# Patient Record
Sex: Female | Born: 1950 | Race: White | Hispanic: No | Smoking: Former smoker
Health system: Southern US, Community
[De-identification: ages and names within clinical notes are randomized; demographics above are authoritative.]

## PROBLEM LIST (undated history)

## (undated) DIAGNOSIS — C801 Malignant (primary) neoplasm, unspecified: Secondary | ICD-10-CM

## (undated) DIAGNOSIS — E039 Hypothyroidism, unspecified: Secondary | ICD-10-CM

## (undated) DIAGNOSIS — K219 Gastro-esophageal reflux disease without esophagitis: Secondary | ICD-10-CM

## (undated) DIAGNOSIS — Z8542 Personal history of malignant neoplasm of other parts of uterus: Secondary | ICD-10-CM

## (undated) DIAGNOSIS — F32A Depression, unspecified: Secondary | ICD-10-CM

## (undated) DIAGNOSIS — J45909 Unspecified asthma, uncomplicated: Secondary | ICD-10-CM

## (undated) DIAGNOSIS — F329 Major depressive disorder, single episode, unspecified: Secondary | ICD-10-CM

## (undated) HISTORY — DX: Unspecified asthma, uncomplicated: J45.909

## (undated) HISTORY — DX: Malignant (primary) neoplasm, unspecified: C80.1

## (undated) HISTORY — DX: Personal history of malignant neoplasm of other parts of uterus: Z85.42

## (undated) HISTORY — DX: Hypothyroidism, unspecified: E03.9

## (undated) HISTORY — DX: Gastro-esophageal reflux disease without esophagitis: K21.9

## (undated) HISTORY — DX: Depression, unspecified: F32.A

## (undated) HISTORY — DX: Major depressive disorder, single episode, unspecified: F32.9

---

## 2003-10-09 DIAGNOSIS — D229 Melanocytic nevi, unspecified: Secondary | ICD-10-CM

## 2003-10-09 HISTORY — DX: Melanocytic nevi, unspecified: D22.9

## 2004-11-18 ENCOUNTER — Ambulatory Visit (HOSPITAL_COMMUNITY): Admission: RE | Admit: 2004-11-18 | Discharge: 2004-11-18 | Payer: Self-pay | Admitting: Gastroenterology

## 2005-01-06 ENCOUNTER — Encounter: Admission: RE | Admit: 2005-01-06 | Discharge: 2005-01-06 | Payer: Self-pay | Admitting: Internal Medicine

## 2008-01-31 HISTORY — PX: TOTAL ABDOMINAL HYSTERECTOMY W/ BILATERAL SALPINGOOPHORECTOMY: SHX83

## 2008-04-30 DIAGNOSIS — C801 Malignant (primary) neoplasm, unspecified: Secondary | ICD-10-CM

## 2008-04-30 HISTORY — DX: Malignant (primary) neoplasm, unspecified: C80.1

## 2008-04-30 HISTORY — PX: ABDOMINAL HYSTERECTOMY: SHX81

## 2008-05-26 ENCOUNTER — Ambulatory Visit: Admission: RE | Admit: 2008-05-26 | Discharge: 2008-05-26 | Payer: Self-pay | Admitting: Gynecologic Oncology

## 2008-06-02 ENCOUNTER — Encounter: Payer: Self-pay | Admitting: Gynecologic Oncology

## 2008-06-02 ENCOUNTER — Ambulatory Visit (HOSPITAL_COMMUNITY): Admission: RE | Admit: 2008-06-02 | Discharge: 2008-06-03 | Payer: Self-pay | Admitting: Gynecologic Oncology

## 2008-06-15 ENCOUNTER — Ambulatory Visit: Payer: Self-pay | Admitting: Oncology

## 2008-06-17 ENCOUNTER — Ambulatory Visit: Admission: RE | Admit: 2008-06-17 | Discharge: 2008-06-17 | Payer: Self-pay | Admitting: Gynecologic Oncology

## 2008-06-19 ENCOUNTER — Ambulatory Visit: Admission: RE | Admit: 2008-06-19 | Discharge: 2008-09-17 | Payer: Self-pay | Admitting: Radiation Oncology

## 2008-06-24 LAB — CBC WITH DIFFERENTIAL/PLATELET
BASO%: 1.1 % (ref 0.0–2.0)
EOS%: 12 % — ABNORMAL HIGH (ref 0.0–7.0)
MCH: 30.5 pg (ref 25.1–34.0)
MCHC: 34.2 g/dL (ref 31.5–36.0)
MONO#: 0.6 10*3/uL (ref 0.1–0.9)
RBC: 4.34 10*6/uL (ref 3.70–5.45)
RDW: 12.8 % (ref 11.2–14.5)
WBC: 8.1 10*3/uL (ref 3.9–10.3)
lymph#: 1.9 10*3/uL (ref 0.9–3.3)

## 2008-06-24 LAB — COMPREHENSIVE METABOLIC PANEL
ALT: 50 U/L — ABNORMAL HIGH (ref 0–35)
AST: 23 U/L (ref 0–37)
CO2: 27 mEq/L (ref 19–32)
Calcium: 9.4 mg/dL (ref 8.4–10.5)
Chloride: 102 mEq/L (ref 96–112)
Sodium: 139 mEq/L (ref 135–145)
Total Protein: 7.4 g/dL (ref 6.0–8.3)

## 2008-07-03 LAB — CBC WITH DIFFERENTIAL/PLATELET
BASO%: 1.6 % (ref 0.0–2.0)
LYMPH%: 22.8 % (ref 14.0–49.7)
MCHC: 34.1 g/dL (ref 31.5–36.0)
MONO#: 0.1 10*3/uL (ref 0.1–0.9)
Platelets: 231 10*3/uL (ref 145–400)
RBC: 4.26 10*6/uL (ref 3.70–5.45)
WBC: 3.7 10*3/uL — ABNORMAL LOW (ref 3.9–10.3)

## 2008-07-10 LAB — COMPREHENSIVE METABOLIC PANEL
ALT: 23 U/L (ref 0–35)
CO2: 23 mEq/L (ref 19–32)
Calcium: 9.2 mg/dL (ref 8.4–10.5)
Chloride: 106 mEq/L (ref 96–112)
Potassium: 4.1 mEq/L (ref 3.5–5.3)
Sodium: 141 mEq/L (ref 135–145)
Total Protein: 7.3 g/dL (ref 6.0–8.3)

## 2008-07-10 LAB — CBC WITH DIFFERENTIAL/PLATELET
BASO%: 1.7 % (ref 0.0–2.0)
HCT: 37 % (ref 34.8–46.6)
MCHC: 35.2 g/dL (ref 31.5–36.0)
MONO#: 0.4 10*3/uL (ref 0.1–0.9)
NEUT%: 22.7 % — ABNORMAL LOW (ref 38.4–76.8)
RBC: 4.25 10*6/uL (ref 3.70–5.45)
WBC: 2.6 10*3/uL — ABNORMAL LOW (ref 3.9–10.3)
lymph#: 1.3 10*3/uL (ref 0.9–3.3)

## 2008-07-13 LAB — CBC WITH DIFFERENTIAL/PLATELET
BASO%: 0.7 % (ref 0.0–2.0)
LYMPH%: 28.2 % (ref 14.0–49.7)
MCHC: 33.5 g/dL (ref 31.5–36.0)
MONO#: 0.6 10*3/uL (ref 0.1–0.9)
RBC: 4.53 10*6/uL (ref 3.70–5.45)
WBC: 5.6 10*3/uL (ref 3.9–10.3)
lymph#: 1.6 10*3/uL (ref 0.9–3.3)

## 2008-07-17 LAB — CBC WITH DIFFERENTIAL/PLATELET
BASO%: 0.1 % (ref 0.0–2.0)
HCT: 38.7 % (ref 34.8–46.6)
HGB: 13.4 g/dL (ref 11.6–15.9)
MCHC: 34.6 g/dL (ref 31.5–36.0)
MONO#: 0.1 10*3/uL (ref 0.1–0.9)
NEUT%: 88.3 % — ABNORMAL HIGH (ref 38.4–76.8)
RDW: 13.6 % (ref 11.2–14.5)
WBC: 8.4 10*3/uL (ref 3.9–10.3)
lymph#: 0.8 10*3/uL — ABNORMAL LOW (ref 0.9–3.3)

## 2008-07-23 LAB — URINALYSIS, MICROSCOPIC - CHCC
Bilirubin (Urine): NEGATIVE
Ketones: NEGATIVE mg/dL
Protein: <30 mg/dL
Specific Gravity, Urine: 1.025 (ref 1.003–1.035)

## 2008-07-25 LAB — URINE CULTURE

## 2008-07-29 ENCOUNTER — Ambulatory Visit: Payer: Self-pay | Admitting: Oncology

## 2008-07-31 LAB — CBC WITH DIFFERENTIAL/PLATELET
BASO%: 0.5 % (ref 0.0–2.0)
Basophils Absolute: 0 10*3/uL (ref 0.0–0.1)
EOS%: 3.5 % (ref 0.0–7.0)
MCH: 30.6 pg (ref 25.1–34.0)
MCHC: 35.1 g/dL (ref 31.5–36.0)
MCV: 87.2 fL (ref 79.5–101.0)
MONO%: 7.7 % (ref 0.0–14.0)
RBC: 3.98 10*6/uL (ref 3.70–5.45)
RDW: 14.3 % (ref 11.2–14.5)

## 2008-07-31 LAB — COMPREHENSIVE METABOLIC PANEL
ALT: 21 U/L (ref 0–35)
AST: 19 U/L (ref 0–37)
Alkaline Phosphatase: 89 U/L (ref 39–117)
Potassium: 4.1 mEq/L (ref 3.5–5.3)
Sodium: 138 mEq/L (ref 135–145)
Total Bilirubin: 0.4 mg/dL (ref 0.3–1.2)
Total Protein: 7.2 g/dL (ref 6.0–8.3)

## 2008-08-07 LAB — CBC WITH DIFFERENTIAL/PLATELET
BASO%: 0.4 % (ref 0.0–2.0)
EOS%: 0 % (ref 0.0–7.0)
Eosinophils Absolute: 0 10*3/uL (ref 0.0–0.5)
LYMPH%: 21.7 % (ref 14.0–49.7)
MCH: 29.4 pg (ref 25.1–34.0)
MCHC: 34.5 g/dL (ref 31.5–36.0)
MCV: 85.4 fL (ref 79.5–101.0)
MONO%: 1.2 % (ref 0.0–14.0)
Platelets: 243 10*3/uL (ref 145–400)
RBC: 4.18 10*6/uL (ref 3.70–5.45)

## 2008-08-21 ENCOUNTER — Ambulatory Visit (HOSPITAL_COMMUNITY): Admission: RE | Admit: 2008-08-21 | Discharge: 2008-08-21 | Payer: Self-pay | Admitting: Radiation Oncology

## 2008-09-02 ENCOUNTER — Ambulatory Visit: Payer: Self-pay | Admitting: Oncology

## 2008-09-04 LAB — CBC WITH DIFFERENTIAL/PLATELET
BASO%: 0.3 % (ref 0.0–2.0)
Basophils Absolute: 0 10*3/uL (ref 0.0–0.1)
EOS%: 3.7 % (ref 0.0–7.0)
HCT: 33.6 % — ABNORMAL LOW (ref 34.8–46.6)
HGB: 11.8 g/dL (ref 11.6–15.9)
LYMPH%: 18.6 % (ref 14.0–49.7)
MCH: 31.2 pg (ref 25.1–34.0)
MCHC: 35 g/dL (ref 31.5–36.0)
MCV: 89.1 fL (ref 79.5–101.0)
MONO%: 11.5 % (ref 0.0–14.0)
NEUT%: 65.9 % (ref 38.4–76.8)

## 2008-09-18 ENCOUNTER — Ambulatory Visit: Admission: RE | Admit: 2008-09-18 | Discharge: 2008-12-17 | Payer: Self-pay | Admitting: Radiation Oncology

## 2008-10-13 LAB — CBC WITH DIFFERENTIAL/PLATELET
BASO%: 0.4 % (ref 0.0–2.0)
HCT: 32.6 % — ABNORMAL LOW (ref 34.8–46.6)
LYMPH%: 13.9 % — ABNORMAL LOW (ref 14.0–49.7)
MCH: 32.7 pg (ref 25.1–34.0)
MCHC: 35.6 g/dL (ref 31.5–36.0)
MONO#: 0.3 10*3/uL (ref 0.1–0.9)
NEUT%: 70.3 % (ref 38.4–76.8)
Platelets: 269 10*3/uL (ref 145–400)
WBC: 3.7 10*3/uL — ABNORMAL LOW (ref 3.9–10.3)

## 2008-10-13 LAB — COMPREHENSIVE METABOLIC PANEL
ALT: 31 U/L (ref 0–35)
CO2: 27 mEq/L (ref 19–32)
Creatinine, Ser: 0.63 mg/dL (ref 0.40–1.20)
Total Bilirubin: 0.7 mg/dL (ref 0.3–1.2)

## 2008-10-16 ENCOUNTER — Ambulatory Visit: Payer: Self-pay | Admitting: Oncology

## 2008-10-20 ENCOUNTER — Other Ambulatory Visit: Admission: RE | Admit: 2008-10-20 | Discharge: 2008-10-20 | Payer: Self-pay | Admitting: Internal Medicine

## 2008-10-20 LAB — CBC WITH DIFFERENTIAL/PLATELET
Basophils Absolute: 0 10*3/uL (ref 0.0–0.1)
Eosinophils Absolute: 0.1 10*3/uL (ref 0.0–0.5)
HCT: 33.5 % — ABNORMAL LOW (ref 34.8–46.6)
LYMPH%: 14.3 % (ref 14.0–49.7)
MCHC: 35.8 g/dL (ref 31.5–36.0)
MONO#: 0.3 10*3/uL (ref 0.1–0.9)
NEUT#: 2 10*3/uL (ref 1.5–6.5)
NEUT%: 69.4 % (ref 38.4–76.8)
Platelets: 238 10*3/uL (ref 145–400)
WBC: 2.8 10*3/uL — ABNORMAL LOW (ref 3.9–10.3)

## 2008-10-20 LAB — COMPREHENSIVE METABOLIC PANEL
AST: 14 U/L (ref 0–37)
Albumin: 4.3 g/dL (ref 3.5–5.2)
BUN: 14 mg/dL (ref 6–23)
CO2: 21 mEq/L (ref 19–32)
Calcium: 9.2 mg/dL (ref 8.4–10.5)
Chloride: 106 mEq/L (ref 96–112)
Creatinine, Ser: 0.62 mg/dL (ref 0.40–1.20)
Potassium: 4.2 mEq/L (ref 3.5–5.3)

## 2008-10-20 LAB — URINALYSIS, MICROSCOPIC - CHCC
Bilirubin (Urine): NEGATIVE
Ketones: 5 mg/dL
Protein: NEGATIVE mg/dL
pH: 6 (ref 4.6–8.0)

## 2008-10-22 LAB — URINE CULTURE

## 2008-10-27 ENCOUNTER — Ambulatory Visit: Admission: RE | Admit: 2008-10-27 | Discharge: 2008-10-27 | Payer: Self-pay | Admitting: Gynecologic Oncology

## 2008-11-09 ENCOUNTER — Ambulatory Visit: Payer: Self-pay | Admitting: Oncology

## 2008-11-11 LAB — CBC WITH DIFFERENTIAL/PLATELET
Eosinophils Absolute: 0 10*3/uL (ref 0.0–0.5)
HCT: 37.3 % (ref 34.8–46.6)
LYMPH%: 6.1 % — ABNORMAL LOW (ref 14.0–49.7)
MCHC: 34.3 g/dL (ref 31.5–36.0)
MCV: 93.3 fL (ref 79.5–101.0)
MONO#: 0 10*3/uL — ABNORMAL LOW (ref 0.1–0.9)
MONO%: 0.3 % (ref 0.0–14.0)
NEUT%: 93.5 % — ABNORMAL HIGH (ref 38.4–76.8)
Platelets: 266 10*3/uL (ref 145–400)
WBC: 7.4 10*3/uL (ref 3.9–10.3)

## 2008-11-26 LAB — CBC WITH DIFFERENTIAL/PLATELET
BASO%: 0.1 % (ref 0.0–2.0)
EOS%: 2.3 % (ref 0.0–7.0)
HCT: 32.9 % — ABNORMAL LOW (ref 34.8–46.6)
LYMPH%: 12.4 % — ABNORMAL LOW (ref 14.0–49.7)
MCH: 33.4 pg (ref 25.1–34.0)
MCHC: 35.5 g/dL (ref 31.5–36.0)
MONO%: 9.8 % (ref 0.0–14.0)
NEUT%: 75.4 % (ref 38.4–76.8)
Platelets: 190 10*3/uL (ref 145–400)

## 2008-11-26 LAB — COMPREHENSIVE METABOLIC PANEL
ALT: 18 U/L (ref 0–35)
AST: 17 U/L (ref 0–37)
Alkaline Phosphatase: 66 U/L (ref 39–117)
Creatinine, Ser: 0.52 mg/dL (ref 0.40–1.20)
Total Bilirubin: 0.3 mg/dL (ref 0.3–1.2)

## 2008-12-02 LAB — CBC WITH DIFFERENTIAL/PLATELET
Basophils Absolute: 0 10*3/uL (ref 0.0–0.1)
Eosinophils Absolute: 0 10*3/uL (ref 0.0–0.5)
HGB: 12.5 g/dL (ref 11.6–15.9)
MCV: 95 fL (ref 79.5–101.0)
MONO#: 0 10*3/uL — ABNORMAL LOW (ref 0.1–0.9)
NEUT#: 7.3 10*3/uL — ABNORMAL HIGH (ref 1.5–6.5)
RBC: 3.79 10*6/uL (ref 3.70–5.45)
RDW: 13.1 % (ref 11.2–14.5)
WBC: 7.8 10*3/uL (ref 3.9–10.3)
lymph#: 0.4 10*3/uL — ABNORMAL LOW (ref 0.9–3.3)

## 2008-12-11 ENCOUNTER — Ambulatory Visit: Payer: Self-pay | Admitting: Oncology

## 2008-12-15 LAB — COMPREHENSIVE METABOLIC PANEL
Albumin: 4.4 g/dL (ref 3.5–5.2)
Alkaline Phosphatase: 86 U/L (ref 39–117)
BUN: 19 mg/dL (ref 6–23)
CO2: 22 mEq/L (ref 19–32)
Glucose, Bld: 109 mg/dL — ABNORMAL HIGH (ref 70–99)
Sodium: 138 mEq/L (ref 135–145)
Total Bilirubin: 0.5 mg/dL (ref 0.3–1.2)
Total Protein: 7.1 g/dL (ref 6.0–8.3)

## 2008-12-15 LAB — CBC WITH DIFFERENTIAL/PLATELET
Basophils Absolute: 0 10*3/uL (ref 0.0–0.1)
Eosinophils Absolute: 0.1 10*3/uL (ref 0.0–0.5)
HCT: 32.9 % — ABNORMAL LOW (ref 34.8–46.6)
HGB: 11.4 g/dL — ABNORMAL LOW (ref 11.6–15.9)
LYMPH%: 7.9 % — ABNORMAL LOW (ref 14.0–49.7)
MCV: 94.8 fL (ref 79.5–101.0)
MONO#: 0.4 10*3/uL (ref 0.1–0.9)
MONO%: 4 % (ref 0.0–14.0)
NEUT#: 7.8 10*3/uL — ABNORMAL HIGH (ref 1.5–6.5)
Platelets: 174 10*3/uL (ref 145–400)
WBC: 9 10*3/uL (ref 3.9–10.3)

## 2008-12-23 LAB — CBC WITH DIFFERENTIAL/PLATELET
Basophils Absolute: 0 10*3/uL (ref 0.0–0.1)
Eosinophils Absolute: 0 10*3/uL (ref 0.0–0.5)
HGB: 11.5 g/dL — ABNORMAL LOW (ref 11.6–15.9)
MCV: 94.3 fL (ref 79.5–101.0)
MONO#: 0 10*3/uL — ABNORMAL LOW (ref 0.1–0.9)
MONO%: 0.7 % (ref 0.0–14.0)
NEUT#: 5.7 10*3/uL (ref 1.5–6.5)
Platelets: 181 10*3/uL (ref 145–400)
RBC: 3.49 10*6/uL — ABNORMAL LOW (ref 3.70–5.45)
RDW: 15.2 % — ABNORMAL HIGH (ref 11.2–14.5)
WBC: 6.1 10*3/uL (ref 3.9–10.3)

## 2008-12-30 ENCOUNTER — Encounter: Admission: RE | Admit: 2008-12-30 | Discharge: 2008-12-30 | Payer: Self-pay | Admitting: Oncology

## 2009-01-25 ENCOUNTER — Ambulatory Visit (HOSPITAL_COMMUNITY): Admission: RE | Admit: 2009-01-25 | Discharge: 2009-01-25 | Payer: Self-pay | Admitting: Oncology

## 2009-01-27 ENCOUNTER — Ambulatory Visit: Admission: RE | Admit: 2009-01-27 | Discharge: 2009-01-27 | Payer: Self-pay | Admitting: Gynecologic Oncology

## 2009-03-30 ENCOUNTER — Encounter (INDEPENDENT_AMBULATORY_CARE_PROVIDER_SITE_OTHER): Payer: Self-pay | Admitting: *Deleted

## 2009-04-16 ENCOUNTER — Ambulatory Visit: Payer: Self-pay | Admitting: Internal Medicine

## 2009-04-16 DIAGNOSIS — J309 Allergic rhinitis, unspecified: Secondary | ICD-10-CM | POA: Insufficient documentation

## 2009-04-16 DIAGNOSIS — K219 Gastro-esophageal reflux disease without esophagitis: Secondary | ICD-10-CM | POA: Insufficient documentation

## 2009-04-16 DIAGNOSIS — C55 Malignant neoplasm of uterus, part unspecified: Secondary | ICD-10-CM

## 2009-04-16 DIAGNOSIS — E039 Hypothyroidism, unspecified: Secondary | ICD-10-CM

## 2009-04-16 DIAGNOSIS — G479 Sleep disorder, unspecified: Secondary | ICD-10-CM | POA: Insufficient documentation

## 2009-04-22 ENCOUNTER — Ambulatory Visit: Payer: Self-pay | Admitting: Oncology

## 2009-04-26 LAB — CBC WITH DIFFERENTIAL/PLATELET
BASO%: 0.4 % (ref 0.0–2.0)
Basophils Absolute: 0 10*3/uL (ref 0.0–0.1)
EOS%: 3.3 % (ref 0.0–7.0)
Eosinophils Absolute: 0.2 10*3/uL (ref 0.0–0.5)
HCT: 38.6 % (ref 34.8–46.6)
HGB: 13.6 g/dL (ref 11.6–15.9)
LYMPH%: 16.6 % (ref 14.0–49.7)
MCH: 33 pg (ref 25.1–34.0)
MCHC: 35.1 g/dL (ref 31.5–36.0)
MCV: 94 fL (ref 79.5–101.0)
MONO#: 0.4 10*3/uL (ref 0.1–0.9)
MONO%: 8.8 % (ref 0.0–14.0)
NEUT#: 3.5 10*3/uL (ref 1.5–6.5)
NEUT%: 70.9 % (ref 38.4–76.8)
Platelets: 230 10*3/uL (ref 145–400)
RBC: 4.11 10*6/uL (ref 3.70–5.45)
RDW: 12.8 % (ref 11.2–14.5)
WBC: 4.9 10*3/uL (ref 3.9–10.3)
lymph#: 0.8 10*3/uL — ABNORMAL LOW (ref 0.9–3.3)

## 2009-04-26 LAB — COMPREHENSIVE METABOLIC PANEL
ALT: 10 U/L (ref 0–35)
AST: 13 U/L (ref 0–37)
Albumin: 4.5 g/dL (ref 3.5–5.2)
Alkaline Phosphatase: 60 U/L (ref 39–117)
BUN: 22 mg/dL (ref 6–23)
CO2: 25 mEq/L (ref 19–32)
Calcium: 9.3 mg/dL (ref 8.4–10.5)
Chloride: 104 mEq/L (ref 96–112)
Creatinine, Ser: 0.6 mg/dL (ref 0.40–1.20)
Glucose, Bld: 93 mg/dL (ref 70–99)
Potassium: 4 mEq/L (ref 3.5–5.3)
Sodium: 139 mEq/L (ref 135–145)
Total Bilirubin: 0.6 mg/dL (ref 0.3–1.2)
Total Protein: 6.9 g/dL (ref 6.0–8.3)

## 2009-06-29 ENCOUNTER — Ambulatory Visit: Payer: Self-pay | Admitting: Oncology

## 2009-06-30 ENCOUNTER — Ambulatory Visit (HOSPITAL_COMMUNITY): Admission: RE | Admit: 2009-06-30 | Discharge: 2009-06-30 | Payer: Self-pay | Admitting: Oncology

## 2009-06-30 LAB — CBC WITH DIFFERENTIAL/PLATELET
BASO%: 0.5 % (ref 0.0–2.0)
Basophils Absolute: 0 10*3/uL (ref 0.0–0.1)
EOS%: 3 % (ref 0.0–7.0)
Eosinophils Absolute: 0.1 10*3/uL (ref 0.0–0.5)
HCT: 39 % (ref 34.8–46.6)
HGB: 13.7 g/dL (ref 11.6–15.9)
LYMPH%: 17.7 % (ref 14.0–49.7)
MCH: 32.9 pg (ref 25.1–34.0)
MCHC: 35.2 g/dL (ref 31.5–36.0)
MCV: 93.5 fL (ref 79.5–101.0)
MONO#: 0.4 10*3/uL (ref 0.1–0.9)
MONO%: 8.8 % (ref 0.0–14.0)
NEUT#: 2.9 10*3/uL (ref 1.5–6.5)
NEUT%: 70 % (ref 38.4–76.8)
Platelets: 245 10*3/uL (ref 145–400)
RBC: 4.17 10*6/uL (ref 3.70–5.45)
RDW: 13.6 % (ref 11.2–14.5)
WBC: 4.2 10*3/uL (ref 3.9–10.3)
lymph#: 0.7 10*3/uL — ABNORMAL LOW (ref 0.9–3.3)

## 2009-06-30 LAB — COMPREHENSIVE METABOLIC PANEL
ALT: 15 U/L (ref 0–35)
AST: 18 U/L (ref 0–37)
Albumin: 3.9 g/dL (ref 3.5–5.2)
Alkaline Phosphatase: 62 U/L (ref 39–117)
BUN: 14 mg/dL (ref 6–23)
CO2: 28 mEq/L (ref 19–32)
Calcium: 9.6 mg/dL (ref 8.4–10.5)
Chloride: 103 mEq/L (ref 96–112)
Creatinine, Ser: 0.52 mg/dL (ref 0.40–1.20)
Glucose, Bld: 100 mg/dL — ABNORMAL HIGH (ref 70–99)
Potassium: 4.2 mEq/L (ref 3.5–5.3)
Sodium: 139 mEq/L (ref 135–145)
Total Bilirubin: 0.9 mg/dL (ref 0.3–1.2)
Total Protein: 7.2 g/dL (ref 6.0–8.3)

## 2009-06-30 LAB — TSH: TSH: 2.72 u[IU]/mL (ref 0.350–4.500)

## 2009-07-07 ENCOUNTER — Ambulatory Visit: Admission: RE | Admit: 2009-07-07 | Discharge: 2009-07-07 | Payer: Self-pay | Admitting: Gynecologic Oncology

## 2009-07-07 ENCOUNTER — Other Ambulatory Visit: Admission: RE | Admit: 2009-07-07 | Discharge: 2009-07-07 | Payer: Self-pay | Admitting: Gynecologic Oncology

## 2009-09-27 ENCOUNTER — Ambulatory Visit: Payer: Self-pay | Admitting: Oncology

## 2009-10-19 ENCOUNTER — Encounter: Payer: Self-pay | Admitting: Internal Medicine

## 2009-10-19 LAB — COMPREHENSIVE METABOLIC PANEL
ALT: 12 U/L (ref 0–35)
AST: 14 U/L (ref 0–37)
Albumin: 4.3 g/dL (ref 3.5–5.2)
Alkaline Phosphatase: 69 U/L (ref 39–117)
BUN: 18 mg/dL (ref 6–23)
CO2: 23 mEq/L (ref 19–32)
Calcium: 8.8 mg/dL (ref 8.4–10.5)
Chloride: 106 mEq/L (ref 96–112)
Creatinine, Ser: 0.58 mg/dL (ref 0.40–1.20)
Glucose, Bld: 113 mg/dL — ABNORMAL HIGH (ref 70–99)
Potassium: 4.4 mEq/L (ref 3.5–5.3)
Sodium: 140 mEq/L (ref 135–145)
Total Bilirubin: 0.5 mg/dL (ref 0.3–1.2)
Total Protein: 6.9 g/dL (ref 6.0–8.3)

## 2009-10-19 LAB — CBC WITH DIFFERENTIAL/PLATELET
BASO%: 0.3 % (ref 0.0–2.0)
Basophils Absolute: 0 10*3/uL (ref 0.0–0.1)
EOS%: 3 % (ref 0.0–7.0)
Eosinophils Absolute: 0.1 10*3/uL (ref 0.0–0.5)
HCT: 39.8 % (ref 34.8–46.6)
HGB: 13.5 g/dL (ref 11.6–15.9)
LYMPH%: 18.2 % (ref 14.0–49.7)
MCH: 32 pg (ref 25.1–34.0)
MCHC: 33.9 g/dL (ref 31.5–36.0)
MCV: 94.4 fL (ref 79.5–101.0)
MONO#: 0.3 10*3/uL (ref 0.1–0.9)
MONO%: 6.1 % (ref 0.0–14.0)
NEUT#: 3.5 10*3/uL (ref 1.5–6.5)
NEUT%: 72.4 % (ref 38.4–76.8)
Platelets: 233 10*3/uL (ref 145–400)
RBC: 4.21 10*6/uL (ref 3.70–5.45)
RDW: 13 % (ref 11.2–14.5)
WBC: 4.8 10*3/uL (ref 3.9–10.3)
lymph#: 0.9 10*3/uL (ref 0.9–3.3)

## 2009-12-31 ENCOUNTER — Encounter: Admission: RE | Admit: 2009-12-31 | Discharge: 2009-12-31 | Payer: Self-pay | Admitting: Oncology

## 2010-01-27 ENCOUNTER — Ambulatory Visit
Admission: RE | Admit: 2010-01-27 | Discharge: 2010-01-27 | Payer: Self-pay | Source: Home / Self Care | Attending: Gynecologic Oncology | Admitting: Gynecologic Oncology

## 2010-01-27 ENCOUNTER — Other Ambulatory Visit
Admission: RE | Admit: 2010-01-27 | Discharge: 2010-01-27 | Payer: Self-pay | Source: Home / Self Care | Admitting: Gynecologic Oncology

## 2010-03-01 NOTE — Letter (Signed)
Summary: New Patient Letter  [facility] [place]  [address] [place], [place] [address]   Phone: [phone]  Fax: [phone]       03/30/2009 MRN: [id number]  [name] [address] [place], [place]  [address]  Dear [name],   Welcome to [facility] and thank you for choosing [place] as your Primary Care Providers. Enclosed you will find information about our practice that we hope you find helpful. We have also enclosed forms to be filled out prior to your visit. This will provide [place] with the necessary information and facilitate your being seen in a timely manner. If you have any questions, please call us at: [phone] and we will be happy to assist you. We look forward to seeing you at your scheduled appointment time.  Appointment MARCH [phone] @ 2:00 PM  with Dr. Marga Melnick  Sincerely,  Primary Health Care Team  Please arrive 15 minutes early for your first appointment and bring your insurance card. Co-pay is required at the time of your visit.  *****Please call the office if you are not able to keep this appointment. There is a charge of $50.00 if any appointment is not cancelled or rescheduled within 24 hours.

## 2010-03-01 NOTE — Letter (Signed)
Summary: [facility]  [facility]   Imported By: Lanelle Bal 11/10/2009 11:57:43  _____________________________________________________________________  External Attachment:    Type:   Image     Comment:   External Document

## 2010-03-01 NOTE — Assessment & Plan Note (Signed)
Summary: NEW TO [facility]   Vital Signs:  Patient profile:   60 year old female Height:      66.25 inches Weight:      178.4 pounds BMI:     28.68 Temp:     98.8 degrees F oral Pulse rate:   72 / minute Resp:     14 per minute BP sitting:   148 / 86  (left arm) Cuff size:   large  Vitals Entered By: [name] (April 16, 2009 1:32 PM) CC: New patient Establish, Insomnia, Diarrhea Comments REVIEWED MED LIST, PATIENT AGREED DOSE AND INSTRUCTION CORRECT    CC:  New patient Establish, Insomnia, and Diarrhea.  History of Present Illness: [name] has 2 issues : #1) sleep disorder & #2) diarrhea    The patient reports early awakening about 3-4 am, but denies difficulty falling asleep, frequent awakening usually, nightmares, leg movements, snoring, apnea noted by partner, and daytime somnolence.  Cancer Rx may have caused memory loss.  Behaviors that may contribute to insomnia include reading in bed, watching TV in bed, and OTC sleep aids, esp Benadryl . Supplements used : Valerian & Melatonin. Treatments tried in the past and found to be ineffective incude sedatives.  No regular exercise.    The patient reports >6 stools per day, semiformed/loose stools to  watery/unformed stools,  occa voluminous stools, mucus in stool, malodorous stools, fecal urgency,  occasional fecal soiling, alternating diarrhea/constipation (constipation only  with meds), nocturnal diarrhea, gassiness, and abrupt onset of symptoms  after radiation to pelvic area, but denies blood in stool, greasy stools, fasting diarrhea, and bloating.  Associated symptoms include abdominal cramps, nausea, and lightheadedness.  The patient denies fever, abdominal pain, vomiting, joint pains, mouth ulcers, and eye redness.  The symptoms are worse with specific foods,ie raw vegetables, not dairy or wheat.  The symptoms are better with hypomotility agents, Lomotil  but not Immodium . A probiotic helps. Patient has a   history of irritable bowel syndrome.  Colonoscopy negative 2007, Dr Renaldo Reel.  Allergies (verified): 1)  ! Levaquin  Past History:  Past Medical History: Uterine cancer Allergic rhinitis GERD Hypothyroidism, Cytomel Rxed by Dr Ananias Pilgrim  Past Surgical History: Hysterectomy & BSO, S/ P Chemotherapy & Radiation  2010; G 0 P 0  Family History: Father: Deceased, Alcoholism Mother: Deceased, Alcoholism, Arthritis, Stroke, DM Siblings: 1 Brother, 1Sister  Physical Exam  General:  well-nourished; alert,appropriate and cooperative throughout examination Head:  Normocephalic and atraumatic without obvious abnormalities.  Neck:  No deformities, masses, or tenderness noted. Lungs:  Normal respiratory effort, chest expands symmetrically. Lungs are clear to auscultation, no crackles or wheezes. Heart:  Normal rate and regular rhythm. S1 and S2 normal without gallop, murmur, click, rub.S4 Abdomen:  Bowel sounds positive,abdomen soft and non-tender without masses, organomegaly or hernias noted. Msk:  No deformity or scoliosis noted of thoracic or lumbar spine.   Pulses:  R and L carotid,radial,dorsalis pedis and posterior tibial pulses are full and equal bilaterally Extremities:  No clubbing, cyanosis.trace left pedal edema and trace right pedal edema.   Neurologic:  alert & oriented X3 and DTRs symmetrical and normal.   Skin:  Intact without suspicious lesions or rashes Cervical Nodes:  No lymphadenopathy noted Axillary Nodes:  No palpable lymphadenopathy Psych:  memory intact for recent and remote, normally interactive, and good eye contact.     Impression & Recommendations:  Problem # 1:  SLEEP DISORDER (ICD-780.50)  Problem # 2:  DIARRHEA (ICD-787.91)  trigger :  raw vegetables  Her updated medication list for this problem includes:    Lonox 2.5-0.025 Mg Tabs (Diphenoxylate-atropine) .Marland Kitchen... 1 as needed for frank diarrhea  Problem # 3:  UTERINE CANCER, HX OF (ICD-V10.42)  Complete  Medication List: 1)  Prozac 20 Mg Caps (Fluoxetine hcl) .Marland Kitchen.. 1 by mouth once daily 2)  Cytomel 25 Mcg Tabs (Liothyronine sodium) .Marland Kitchen.. 1 by mouth once daily 3)  Lonox 2.5-0.025 Mg Tabs (Diphenoxylate-atropine) .Marland Kitchen.. 1 as needed for frank diarrhea  Patient Instructions: 1)  Sleep Hygiene  & nutritional interventions  as discussed. Sublingual anti spasmotic could be considered for abd cramps as needed  if symptoms persist or progress. Prescriptions: LONOX 2.5-0.025 MG TABS (DIPHENOXYLATE-ATROPINE) 1 as needed for frank diarrhea  #10 x 1   Entered and Authorized by:   Marga Melnick MD   Signed by:   Marga Melnick MD on 04/16/2009   Method used:   Print then Give to Patient   RxID:   [id number]

## 2010-04-04 ENCOUNTER — Encounter: Payer: Self-pay | Admitting: Internal Medicine

## 2010-04-19 ENCOUNTER — Other Ambulatory Visit: Payer: Self-pay | Admitting: Oncology

## 2010-04-19 ENCOUNTER — Ambulatory Visit (HOSPITAL_BASED_OUTPATIENT_CLINIC_OR_DEPARTMENT_OTHER): Payer: BC Managed Care – PPO | Admitting: Oncology

## 2010-04-19 DIAGNOSIS — C549 Malignant neoplasm of corpus uteri, unspecified: Secondary | ICD-10-CM

## 2010-04-19 DIAGNOSIS — K219 Gastro-esophageal reflux disease without esophagitis: Secondary | ICD-10-CM

## 2010-04-19 LAB — CBC WITH DIFFERENTIAL/PLATELET
BASO%: 0.5 % (ref 0.0–2.0)
Basophils Absolute: 0 10*3/uL (ref 0.0–0.1)
EOS%: 5.8 % (ref 0.0–7.0)
Eosinophils Absolute: 0.2 10*3/uL (ref 0.0–0.5)
HCT: 37.5 % (ref 34.8–46.6)
HGB: 13.1 g/dL (ref 11.6–15.9)
LYMPH%: 21.5 % (ref 14.0–49.7)
MCH: 32.9 pg (ref 25.1–34.0)
MCHC: 35 g/dL (ref 31.5–36.0)
MCV: 93.8 fL (ref 79.5–101.0)
MONO#: 0.5 10*3/uL (ref 0.1–0.9)
MONO%: 11.4 % (ref 0.0–14.0)
NEUT#: 2.4 10*3/uL (ref 1.5–6.5)
NEUT%: 60.8 % (ref 38.4–76.8)
Platelets: 229 10*3/uL (ref 145–400)
RBC: 4 10*6/uL (ref 3.70–5.45)
WBC: 3.9 10*3/uL (ref 3.9–10.3)
lymph#: 0.8 10*3/uL — ABNORMAL LOW (ref 0.9–3.3)

## 2010-04-19 LAB — COMPREHENSIVE METABOLIC PANEL
ALT: 16 U/L (ref 0–35)
AST: 15 U/L (ref 0–37)
Albumin: 4.2 g/dL (ref 3.5–5.2)
Alkaline Phosphatase: 50 U/L (ref 39–117)
BUN: 19 mg/dL (ref 6–23)
CO2: 26 mEq/L (ref 19–32)
Calcium: 9.3 mg/dL (ref 8.4–10.5)
Chloride: 103 mEq/L (ref 96–112)
Creatinine, Ser: 0.58 mg/dL (ref 0.40–1.20)
Glucose, Bld: 93 mg/dL (ref 70–99)
Potassium: 3.9 mEq/L (ref 3.5–5.3)
Sodium: 139 mEq/L (ref 135–145)
Total Bilirubin: 0.4 mg/dL (ref 0.3–1.2)
Total Protein: 6.4 g/dL (ref 6.0–8.3)

## 2010-04-19 NOTE — Letter (Signed)
Summary: Dr. Elnoria Howard Progress note  Dr. Elnoria Howard Progress note   Imported By: Kassie Mends 04/15/2010 08:01:02  _____________________________________________________________________  External Attachment:    Type:   Image     Comment:   External Document

## 2010-05-06 LAB — FLOW CYTOMETRY REQUEST - FLUID (INPATIENT)

## 2010-05-10 LAB — BASIC METABOLIC PANEL
BUN: 4 mg/dL — ABNORMAL LOW (ref 6–23)
CO2: 26 mEq/L (ref 19–32)
Calcium: 8.2 mg/dL — ABNORMAL LOW (ref 8.4–10.5)
Chloride: 107 mEq/L (ref 96–112)
Creatinine, Ser: 0.64 mg/dL (ref 0.4–1.2)
GFR calc Af Amer: >60 mL/min (ref >60)
GFR calc non Af Amer: >60 mL/min (ref >60)
Glucose, Bld: 134 mg/dL — ABNORMAL HIGH (ref 70–99)
Potassium: 4.1 mEq/L (ref 3.5–5.1)
Sodium: 137 mEq/L (ref 135–145)

## 2010-05-10 LAB — CBC
HCT: 31.9 % — ABNORMAL LOW (ref 36.0–46.0)
Hemoglobin: 11.1 g/dL — ABNORMAL LOW (ref 12.0–15.0)
MCHC: 34.9 g/dL (ref 30.0–36.0)
MCV: 91.5 fL (ref 78.0–100.0)
Platelets: 246 10*3/uL (ref 150–400)
RBC: 3.49 MIL/uL — ABNORMAL LOW (ref 3.87–5.11)
RDW: 13.4 % (ref 11.5–15.5)
WBC: 7.2 10*3/uL (ref 4.0–10.5)

## 2010-05-11 LAB — COMPREHENSIVE METABOLIC PANEL
ALT: 25 U/L (ref 0–35)
AST: 25 U/L (ref 0–37)
Albumin: 3.8 g/dL (ref 3.5–5.2)
Alkaline Phosphatase: 60 U/L (ref 39–117)
BUN: 16 mg/dL (ref 6–23)
CO2: 26 mEq/L (ref 19–32)
Calcium: 9.5 mg/dL (ref 8.4–10.5)
Chloride: 111 mEq/L (ref 96–112)
Creatinine, Ser: 0.62 mg/dL (ref 0.4–1.2)
GFR calc Af Amer: >60 mL/min (ref >60)
GFR calc non Af Amer: >60 mL/min (ref >60)
Glucose, Bld: 105 mg/dL — ABNORMAL HIGH (ref 70–99)
Potassium: 4 mEq/L (ref 3.5–5.1)
Sodium: 143 mEq/L (ref 135–145)
Total Bilirubin: 0.6 mg/dL (ref 0.3–1.2)
Total Protein: 6.6 g/dL (ref 6.0–8.3)

## 2010-05-11 LAB — CBC
HCT: 35.9 % — ABNORMAL LOW (ref 36.0–46.0)
Hemoglobin: 12.5 g/dL (ref 12.0–15.0)
MCHC: 34.8 g/dL (ref 30.0–36.0)
MCV: 91.4 fL (ref 78.0–100.0)
Platelets: 320 10*3/uL (ref 150–400)
RBC: 3.93 MIL/uL (ref 3.87–5.11)
RDW: 13.6 % (ref 11.5–15.5)
WBC: 5 10*3/uL (ref 4.0–10.5)

## 2010-05-11 LAB — DIFFERENTIAL
Basophils Absolute: 0 10*3/uL (ref 0.0–0.1)
Basophils Relative: 1 % (ref 0–1)
Eosinophils Absolute: 0.2 10*3/uL (ref 0.0–0.7)
Eosinophils Relative: 4 % (ref 0–5)
Lymphocytes Relative: 28 % (ref 12–46)
Lymphs Abs: 1.4 10*3/uL (ref 0.7–4.0)
Monocytes Absolute: 0.4 10*3/uL (ref 0.1–1.0)
Monocytes Relative: 9 % (ref 3–12)
Neutro Abs: 2.9 10*3/uL (ref 1.7–7.7)
Neutrophils Relative %: 59 % (ref 43–77)

## 2010-05-11 LAB — TYPE AND SCREEN
ABO/RH(D): A POS
Antibody Screen: NEGATIVE

## 2010-05-11 LAB — ABO/RH: ABO/RH(D): A POS

## 2010-06-03 ENCOUNTER — Encounter: Payer: Self-pay | Admitting: Internal Medicine

## 2010-06-14 NOTE — Consult Note (Signed)
NAME:  [name], [name]   ACCOUNT NO.:  [id number]   MEDICAL RECORD NO.:  [id number]          PATIENT TYPE:  OUT   LOCATION:  DFTL                         [facility]ACILITY:  [facility]   PHYSICIAN:  Paola A. Duard Brady, MD    DATE OF BIRTH:  10-25-1950   DATE OF CONSULTATION:  05/26/2008  DATE OF DISCHARGE:                                 CONSULTATION   REFERRING PHYSICIAN:  Dr. Miguel Aschoff.   REASON FOR CONSULTATION:  The patient is a 60 year old gravida 0 who in  October 2008 began having some spotting with fairly significant clots  about a quarter size.  This bleeding happened every day and was  increased.  She was seen by Dr. Tenny Craw but due to cervical stenosis, it  was difficult for them to do an endometrial biopsy.  She had an  ultrasound and subsequently went 2 biopsies and the bloody fluid was  sent and there is no cancer identified.  She was scheduled for a D and C  with Dr. Tenny Craw in June 2009 but the patient declined and cancel the  procedure.  She continued having bleeding until April of 2010 at which  time the bleeding was increasing and she was passing a larger sized  clots.  She had endometrial biopsy revealed a grade 1 endometrioid  adenocarcinoma.  Appears that she was placed on mifepristone to stop the  bleeding, which has helped significantly.  She denies any change in her  bowel or bladder habits.  She does have some back pain, which she states  feels internal.  She is not sure if she has gastroesophageal reflux  disease.  She occasionally has some pain in her right side including her  right shoulder.  She denies any shortness of breath, any unintentional  weight loss, weight gain, nausea, vomiting, fevers, chills, headaches,  visual changes, change in bowel or bladder habits.   PAST MEDICAL HISTORY:  Significant for depression and hemorrhoids.   ALLERGIES:  LEVAQUIN which causes hives.   MEDICATIONS:  1. Prozac 20 mg daily.  2. Fish oil.  3. Vitamin D.  4. Digestive  enzymes.  5. Protein shakes.  6. Probiotics.  7. Vitamin B complex.  8. Calcium.  9. Magnesium.  10.Selenium husk.   SOCIAL HISTORY:  Noncontributory.   PAST SURGICAL HISTORY:  Noncontributory.   SOCIAL HISTORY:  She denies tobacco.  She drinks alcohol every day.  She  has 2 beers.  She is a Air traffic controller.  She is  married.   FAMILY HISTORY:  Paternal grandfather had gastric cancer.  Mother had  diabetes.  Her father had cirrhosis due to alcohol and Tylenol.   HEALTH MAINTENANCE:  She had thermography in 2009.  She has not had  mammograms.  She had a colonoscopy 2-3 years ago.   PHYSICAL EXAMINATION:  VITAL SIGNS:  Weight 208 pounds, height 5 feet 6  inches.  CONSTITUTIONAL:  Well-nourished, well-developed female in no acute  distress.  NECK:  Supple.  There is no lymphadenopathy, no thyromegaly.  LUNGS:  Clear to auscultation bilaterally.  CARDIOVASCULAR:  Regular rate and rhythm.  ABDOMEN:  Morbidly obese, soft, nontender, nondistended.  No  palpable  masses or hepatosplenomegaly, though exam is somewhat limited by  habitus.  Groins are negative for adenopathy.  EXTREMITIES:  She has minimal edema.  Trace in bilateral ankles.  There  is no Homans' sign.  There are no palpable cords.  PELVIC:  External genitalia is within normal limits.  Vagina is  atrophic.  The cervix is small and nulliparous.  There is slight pink  discharge.  No obvious lesions.  There is no heavy bleeding.  Bimanual  examination, the cervix is palpably normal.  The corpus does not appear  to be enlarged, though exam is somewhat limited by habitus.  There are  no adnexal masses.   ASSESSMENT:  A 60 year old with a grade 1 clinical stage I endometrioid  adenocarcinoma.  The options for management were discussed with the  patient including surgery, we discussed laparoscopic, robotic, and  traditional laparotomy.  She wished to proceed with minimally invasive  surgery.  We  offered laparoscopic surgery here versus robotic surgery at  [facility], which may provide her with a smaller risk of conversion, but due to  the convenience of it being in [place], she would like to have her  surgery done here.  We discussed different dates and tentatively, she is  scheduled for surgery on Tuesday May 4 with myself to proceed a  laparoscopic hysterectomy, BSO, attempt at pelvic lymph node sampling.  Risks and benefits of surgery including but not limited to bleeding,  infection, injury surrounding organs, thromboembolic disease, neurologic  injury and lymphedema were discussed with the patient.  She understands  that pending the results of her surgery she may require postoperative  therapy.  I have asked her to stop all of her supplements except for her  Prozac, her calcium, her magnesium, and her vitamin D due to potential  changes in her bleeding dyscrasia.  Again, all her questions as were her  husband's questions elicited and answered to their satisfaction.  They  met with Telford Nab, R.N., who will get her set up for surgery.      Paola A. Duard Brady, MD  Electronically Signed     PAG/MEDQ  D:  05/26/2008  T:  05/26/2008  Job:  [id number]   cc:   Miguel Aschoff, M.D.  Fax: [phone]   Telford Nab, R.N.  [address]  [place], [place] [address]

## 2010-06-14 NOTE — Consult Note (Signed)
NAME:  [name], [name]   ACCOUNT NO.:  [id number]   MEDICAL RECORD NO.:  [id number]          PATIENT TYPE:  OUT   LOCATION:  GYN                          [facility]  [facility]   PHYSICIAN:  Paola A. Duard Brady, MD    DATE OF BIRTH:  March 19, 1950   DATE OF CONSULTATION:  06/17/2008  DATE OF DISCHARGE:                                 CONSULTATION   HISTORY:  The patient is a very pleasant 60 year old who had some  postmenopausal bleeding and underwent an endometrial biopsy in April  that revealed grade 1 endometrioid adenocarcinoma.  The patient had a  history of bleeding starting back in 2009 at which time she had been  scheduled for a D and C hysteroscopy in June 2009 to assess her vaginal  bleeding that occurred in February 2009 and she declined.  She  subsequently underwent diagnostic laparoscopy, total laparoscopic  hysterectomy, BSO, bilateral pelvic and aortic lymph node dissection.  Her surgery was on Jun 02, 2008.  Operative findings included a 2 cm  pedunculated myoma on her right side.  She had a 3 cm posterior  intermural fibroid.  Frozen section was consistent with a grade 1  carcinoma with greater than 50% myometrial invasion.  Final pathology  revealed a grade 2 endometrioid adenocarcinoma with involvement of the  lower uterine segment.  There was lymphovascular space involvement.  In  addition, the myometrial invasion was 1 cm out of 1.8 cm.  Periaortic  lymph node was 0 out of 1.  However, she did have 2 out of 2 left pelvic  lymph nodes and 3 out of 3 positive right pelvic lymph nodes.  I have  spoken to the patient about these results over the phone as did Telford Nab and she comes in today for further consultation and  discussion.   She states from a surgical standpoint, she is doing very well and really  offers no significant complaints.  She denies any bleeding, any  abdominal pain.  I answered her questions today regarding adjuvant  therapy.  Her questions  were elicited and answered to her satisfaction.  We spent 30 minutes face-to-face time.  She is currently scheduled to  see Dr. Antony Blackbird on Jun 22, 2008 at 9:30 in the morning followed by  chemotherapy teaching class on Jun 23, 2008 and an appointment with Dr.  Darrold Span on Jun 24, 2008 at noon.  We discussed sandwich chemotherapy.  There is a GOG protocol for which she is eligible at [facility].  However, she does not wish to be treated at [facility] and wishes to be  treated here in [place].  I would recommend 3 cycles of paclitaxel  and carboplatin followed by pelvic and volume-directed radiation  followed by an additional 3 cycles of chemotherapy.  I believe she would  benefit from CT scan mid therapy to ensure that there has been no  progression of disease due to her fairly significant  lymphatic involvement.  Risks and benefits of these were discussed with  the patient, but she knows that they will go into much further detail  with Dr. Roselind Messier and Dr. Darrold Span.  The patient had requested  a  prescription for Lunesta for sleep.  However, she left today before that  prescription could be given to her.  We will call it in for her for  sleep.      Paola A. Duard Brady, MD  Electronically Signed     PAG/MEDQ  D:  06/17/2008  T:  06/17/2008  Job:  [id number]   cc:   Telford Nab, R.N.  [address]  [place], [place] [address]   Miguel Aschoff, M.D.  Fax: [phone]   Lennis P. Darrold Span, M.D.  Fax: [phone]

## 2010-06-14 NOTE — Op Note (Signed)
NAME:  [name], [name]   ACCOUNT NO.:  [id number]   MEDICAL RECORD NO.:  [id number]          PATIENT TYPE:  AMB   LOCATION:  DAY                          FACILITY[facility]   PHYSICIAN:  Kerry A. Duard Brady, MD    DATE OF BIRTH:  Nov 17, 1950   DATE OF PROCEDURE:  06/02/2008  DATE OF DISCHARGE:                               OPERATIVE REPORT   PREOPERATIVE DIAGNOSIS:  Grade 1 endometrial carcinoma.   POSTOPERATIVE DIAGNOSIS:  Grade 1 endometrial carcinoma.   PROCEDURE:  Total laparoscopic hysterectomy, bilateral salpingo-  oophorectomy, and bilateral pelvic and right periaortic lymph nodes.   SURGEONS:  [name], M.D., and [name], M.D.   ASSISTANT:  [name], R.N.   ANESTHESIA:  General.   ANESTHESIOLOGIST:  [name], M.D.   ESTIMATED BLOOD LOSS:  Less than 15 ml.   URINE OUTPUT:  100 ml.   IV FLUIDS:  2250 ml.   SPECIMENS:  Washings, cervix, uterus, bilateral tubes and ovaries,  bilateral pelvic and right periaortic lymph nodes to pathology.   COMPLICATIONS:  None.   OPERATIVE FINDINGS:  A 2-cm pedunculated myoma on the patient's right  side.  She had a 3-cm posterior intramural fibroid.  Frozen section was  consistent with a grade 1 carcinoma with greater than 50% myometrial  invasion.  No cervical stromal invasion.   DESCRIPTION OF PROCEDURE:  The patient was taken to the operating room  and placed in the supine position.  General anesthesia was then induced  after tucking her arms to her comfort level and safety.  She also had  SCD placed.  General anesthesia was then induced.  She was then placed  in the dorsal lithotomy position with SCD and all appropriate cautions,  and shoulder blocks were placed with all appropriate precautions.  The  abdomen was then prepped in the usual fashion.  The perineum was then  prepped in the usual fashion as was the vagina, and the Foley catheter  was inserted to the bladder under sterile  conditions.  A timeout was  performed to confirm the patient, the procedure, allergy, antibiotic  status, and staff.  A sterile speculum was placed into the vagina.  The  cervix was identified and was grasped with a single-toothed tenaculum.  The cervix was dilated without difficulty.  The ZUMI with the medium co-  ring was placed in the usual fashion and the pneumo-occluder balloon was  placed over it.  The patient was then draped.  A vertical infraumbilical  incision was made in the umbilicus with the knife after injecting it  with 2 ml of 0.25% Marcaine plain.  Using the 10-mm OptiVu port, we  entered the abdomen.  The abdomen was then insufflated with CO2 gas at  this point and all points during the patient's procedure.  Peak  abdominal pressures did not increase over 13 mmHg.  Once abdominal  insufflation was adequate, the patient was then placed in Trendelenburg.  Two 5-mm ports were placed under direct visualization 2 cm above and  medial to the ACIS.  A 10/12 suprapubic port was placed under  visualization.  Abdominopelvic washings were obtained.  The small  bowel  was folded back on its mesentery to allow the possibility of periaortic  lymphadenectomy.  There were some filmy adhesions of the rectosigmoid  colon to the left pelvic sidewall.  These were taken down using sharp  dissection.  Our attention was first drawn to the patient's left  sidewall.  The round ligament on the left side was transected with  monopolar cautery and the anterior and posterior leafs of the broad  ligament were opened.  The ureter was identified on the left side.  A  window was made between the IP and the ureter.  The IP was skeletonized  and coagulated with the bipolar cautery using a Kleppinger.  The IP was  then transected.  We then skeletonized the uterine vessels on the  patient's right side.  The bladder was mobilized to a point below the  level of the co-ring.  The uterine vessels were then  coagulated with  Kleppinger electrocautery.  A similar procedure was done on the  patient's right side.  Once the bilateral uterine vessels were  coagulated, the pneumo-occluder balloon was insufflated.  The colpotomy  was then created with monopolar cautery.  Once the colpotomy was  completed, the specimen was delivered through the vagina.  The pneumo-  occluder balloon was replaced.  The vaginal cuff was closed using three  sutures of 0-Vicryl on a UR-6.  Frozen section at this time returned as  a greater than 50% myometrial invasion.  Ray-Tec was placed into the  abdomen.  Attention was drawn to the periaortic lymph nodes.   The small bowel mesentery was further opened and mobilized to allow  visualization of the bifurcation.  An incision was made along the common  iliac artery on the patient's right side up along the side of the aorta.  We were then able to identify the ureter and we opened the adventitial  tissues to the level of the psoas.  The cystic grasper was then placed  to the level of psoas, pushing the ureter superiorly and lateral from  the area of dissection.  We continued along the common iliac artery and  along the aorta using monopolar cautery, dissecting the lymph nodes.  There was a very large vessel running atop the vena cava.  Care was  taken to avoid injury to this vessel during lymphadenectomy.  The nodal  bundle from the bifurcation up to the level of the duodenum was then  taken off by dissecting over the artery and to the cava.  The nodal  bundle was then separated from the adventitial tissues on the psoas.  The specimen was then delivered.  There was adequate hemostasis.  A Ray-  Tec was placed in this area.   Our attention was then drawn to the patient's right side.  All the  spaces were opened.  We opened the paravesical space and the pararectal  space.  A similar procedure was done on the patient's right side.  We  then turned our attention to doing the  pelvic lymphadenectomy.  Using  monopolar cautery, the nodal bundle extending over the external iliac  artery was taken down.  We mobilized the general femoral nerve and it  was spared.  We continued along the external iliac artery, taking the  nodal bundle off the artery until we reached the circumflex iliac vein.  We then dissected the nodal bundle off of the external iliac vein.  This  was sent as one specimen.  The obturator nerve was then  identified.  The  nodal bundle between the obturator nerve and the external iliac vein was  then grasped with the atraumatic graspers.  The bundle was reflected  medially and inferiorly and we dissected off the psoas bellies using  monopolar cautery.  The bundle was then elevated and dissected off the  obturator nerve.  These two bundles were placed in an EndoCatch bag  through the 10/12 port and delivered as left pelvic node specimen.  Upon  inspection, there was noted to be adequate hemostasis.  The obturator  nerve was spared.  The ureter was well-medial.  A similar procedure was  performed on the patient's left side.  Once the nodal bundle was  removed, the abdomen and pelvis were copiously irrigated.  The Ray-Tec  was removed.  All pedicles were inspected under low-flow and were noted  to be hemostatic.  The trocars were then removed under direct  visualization and the CO2 gas was removed from the patient's abdomen.   The grasper with S-retractors used to identify the fascia of the  infraumbilical incision.  The fascial edges were grasped and suture  ligated and reapproximated with a 0-Vicryl on a UR-6.  A deep subcu  suture was placed with a UR-6, 0 Vicryl in the suprapubic port.  All  skin was closed using 4-0 Vicryl.  Steri-Strips and Benzoin were  applied.  The vagina was swabbed and was noted to be hemostatic.  The shoulder  blocks were removed and there was no bruising or damage to the skin.   The patient tolerated the procedure well  and was taken to the recovery  room in stable condition.      Kerry A. Duard Brady, MD  Electronically Signed     PAG/MEDQ  D:  06/02/2008  T:  06/02/2008  Job:  [id number]   cc:   Miguel Aschoff, M.D.  [facility]  [address]  [place]  [place] [address]  [place]   [name], M.D.  Fax: [phone]   [name], R.N.  [address]  [place], [place] [address]

## 2010-07-08 ENCOUNTER — Other Ambulatory Visit: Payer: Self-pay | Admitting: Internal Medicine

## 2010-07-18 ENCOUNTER — Ambulatory Visit (HOSPITAL_COMMUNITY)
Admission: RE | Admit: 2010-07-18 | Discharge: 2010-07-18 | Disposition: A | Discharge status: Home / Self Care | Payer: BC Managed Care – PPO | Source: Ambulatory Visit | Attending: Gynecologic Oncology | Admitting: Gynecologic Oncology

## 2010-07-18 ENCOUNTER — Other Ambulatory Visit: Payer: Self-pay | Admitting: Gynecologic Oncology

## 2010-07-18 DIAGNOSIS — C55 Malignant neoplasm of uterus, part unspecified: Secondary | ICD-10-CM

## 2010-07-20 ENCOUNTER — Other Ambulatory Visit (HOSPITAL_COMMUNITY)
Admission: RE | Admit: 2010-07-20 | Discharge: 2010-07-20 | Disposition: A | Discharge status: Home / Self Care | Payer: BC Managed Care – PPO | Source: Ambulatory Visit | Attending: Gynecologic Oncology | Admitting: Gynecologic Oncology

## 2010-07-20 ENCOUNTER — Other Ambulatory Visit: Payer: Self-pay | Admitting: Gynecologic Oncology

## 2010-07-20 ENCOUNTER — Ambulatory Visit: Payer: BC Managed Care – PPO | Attending: Gynecologic Oncology | Admitting: Gynecologic Oncology

## 2010-07-20 DIAGNOSIS — C549 Malignant neoplasm of corpus uteri, unspecified: Secondary | ICD-10-CM | POA: Insufficient documentation

## 2010-07-20 DIAGNOSIS — Z8540 Personal history of malignant neoplasm of unspecified female genital organ: Secondary | ICD-10-CM | POA: Insufficient documentation

## 2010-07-20 DIAGNOSIS — Z9221 Personal history of antineoplastic chemotherapy: Secondary | ICD-10-CM | POA: Insufficient documentation

## 2010-07-20 DIAGNOSIS — Z9079 Acquired absence of other genital organ(s): Secondary | ICD-10-CM | POA: Insufficient documentation

## 2010-07-20 DIAGNOSIS — Z923 Personal history of irradiation: Secondary | ICD-10-CM | POA: Insufficient documentation

## 2010-07-20 DIAGNOSIS — Z90710 Acquired absence of both cervix and uterus: Secondary | ICD-10-CM | POA: Insufficient documentation

## 2010-07-21 NOTE — Consult Note (Signed)
NAME:  [name], [name]   ACCOUNT NO.:  [id number]  MEDICAL RECORD NO.:  [id number]  LOCATION:  GYN                          FACILITY:  [facility]  PHYSICIAN:  Tanya Crothers A. Duard Brady, MD    DATE OF BIRTH:  Jan 15, 1951  DATE OF CONSULTATION:  07/20/2010 DATE OF DISCHARGE:                                CONSULTATION   HISTORY OF PRESENT ILLNESS:  Ms. 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  She underwent laparoscopic hysterectomy, BSO and appropriate staging at that time.  Pathology revealed a grade 2 endometrioid adenocarcinoma with LVSI.  She had bilateral pelvic lymph nodes, 2 out of 2 on the left and 3 out of 3 on the right that were positive.  Her pelvic lymph nodes were negative.  She completed chemotherapy and radiation in a sandwich type fashion in November 2010.  She had post- treatment CT scan in December 2010 and June 2011 that revealed no evidence of recurrent disease.  I last saw her in December of 2011 at which time her exam and Pap smear were negative.  She was recently seen by Dr. Darrold Span in March of this year and her labs were unremarkable as well.  Chest x-ray this week was negative.  She comes in today for followup.  She is overall doing quite well and denies any significant complaints.  She has had consistent issues with diarrhea if she takes Imodium and cholestyramine  every day.  She has about 4 formed bowel movements per day but because of the diarrhea as well as development of an anal fissure, she had a colonoscopy in April of 2012 that was otherwise negative.  About twice a month she will have an occasional sharp fleeting pain in the abdomen.  It never wakes her up at night. She started taking Armour Thyroid in May of 2012.  She states she is feeling better.  Her energy level is improved.  She does feel "hotter" but her mood is up and she is very pleased with that.  She is  otherwise enjoying a good quality of life and continues to stay busy with work.  MEDICATIONS:  Medication list is reviewed and is unchanged with exception of the addition of Armour Thyroid 60 mg daily.  HEALTH MAINTENANCE:  She is up-to-date on her mammograms.  FAMILY HISTORY:  There are no new medical problems.  PHYSICAL EXAMINATION:  VITAL SIGNS:  Weight 194 pounds which is up from 186 pounds, blood pressure 182/62, pulse 72, respirations 18. GENERAL:  Well-nourished, well-developed female, in no acute distress. NECK:  Supple.  There is no lymphadenopathy, no thyromegaly. LUNGS:  Clear to auscultation bilaterally. CARDIOVASCULAR EXAM:  Regular rate and rhythm. ABDOMEN:  Soft, nontender, nondistended with no palpable mass or hepatosplenomegaly.  Exam is somewhat limited by habitus.  Groins are negative for adenopathy. EXTREMITIES:  There is 1 to 2+ nonpitting edema equal bilaterally. PELVIC:  External genitalia is within normal limits.  Vagina is markedly atrophic.  The vaginal cuff is visualized.  There are no visible lesions.  ThinPrep Pap was submitted without difficulty,.  Bimanual examination reveals no masses or nodularity.  Rectal confirms.  ASSESSMENT:  A 60 year old with stage 3C endometrioid adenocarcinoma  diagnosed and treated in 2010 who is at about her 2-year anniversary, clinically has no evidence of recurrent disease.  We did discuss the role of imaging and the patient does not wish to have a CT scan due to concerns about radiation exposure.  We will follow up on results from Pap smear from today.  She will return to see Dr. Darrold Span in 3 months and return to see [place] in 6 months.     Sarahanne Novakowski A. Duard Brady, MD     PAG/MEDQ  D:  07/20/2010  T:  07/20/2010  Job:  [id number]  cc:   Telford Nab, R.N. [address] [place], [place] [address]  Titus Dubin. Alwyn Ren, MD,FACP,FCCP [phone] [place] [address]  Billie Lade, Ph.D., M.D. Fax:  [phone]  Reece Packer, M.D. Fax: [phone]  Electronically Signed by Cleda Mccreedy MD on 07/21/2010 03:09:14 PM

## 2010-08-05 ENCOUNTER — Encounter: Payer: Self-pay | Admitting: Internal Medicine

## 2010-08-09 ENCOUNTER — Ambulatory Visit (INDEPENDENT_AMBULATORY_CARE_PROVIDER_SITE_OTHER): Payer: BC Managed Care – PPO | Admitting: Internal Medicine

## 2010-08-09 ENCOUNTER — Encounter: Payer: Self-pay | Admitting: Internal Medicine

## 2010-08-09 DIAGNOSIS — G479 Sleep disorder, unspecified: Secondary | ICD-10-CM

## 2010-08-09 DIAGNOSIS — Z8542 Personal history of malignant neoplasm of other parts of uterus: Secondary | ICD-10-CM

## 2010-08-09 DIAGNOSIS — E039 Hypothyroidism, unspecified: Secondary | ICD-10-CM

## 2010-08-09 NOTE — Progress Notes (Signed)
  Subjective:    Patient ID: [name], female    DOB: 18-Mar-1950, 60 y.o.   MRN: [id number]  HPI Mental Health Symptoms: Onset:Prozac started 06/12/1986 after her mother's death Anxiety:no Depression:no Loss of interest (Anhedonia):no (Note: repeatedly  taking varied classes as CE ) Panic attacks:no Insomnia:sleeping well with Melatonin Anorexia:no Fatigue:no, improved with Armour thyroid Neurologic signs/symptoms: Headache, numbness and tingling, weakness:no Endocrinologic signs and symptoms: Hoarseness, weight change, vision change,   skin/hair,/nail changes:no except rash from baking soda as deodorant.She does have heat intolerance. Stools are loose to watery related to prior radiation. She had a colonoscopy this year which was negative. Family history of mental health issues, alcoholism or drug abuse:Mother had depression after loss of child Medications/efficacy:Prozac 20 mg controls symptoms       Review of Systems     Objective:   Physical Exam  Gen.:  well-nourished; in no acute distress Eyes: Extraocular motion intact; no lid lag or proptosis Neck:  No deformities, thyromegaly, masses, or tenderness noted.   Supple with full range of motion without pain.  Heart: Normal rhythm and rate without significant gallop, or extra heart sounds. Grade 1/2/6 systolic murmur Lungs: Chest clear to auscultation without rales,rales, wheezes Neuro:Deep tendon reflexes are equal and within normal limits; no tremor  Skin: Warm and dry without significant lesions or rashes; no onycholysis Psych:  Cognition and judgment appear intact. Alert, communicative  and cooperative with normal attention span and concentration. No apparent delusions, illusions, hallucinations        Assessment & Plan:  #1 depression, past medical history of. Clinically there is no evidence of such at this time on Prozac 20 mg daily. He neuro transmitter imbalance has been corrected.  #2 hypothyroidism, as  per Dr. Alessandra Bevels  #3 mild changes, postradiation colitis. Dr Renaldo Reel  is treating this with cholestyramine. She's also on probiotics.  Plan: Renew  the Prozac. Physiology of this agent was discussed  #2 Concealed gun permit  form completed.

## 2010-08-09 NOTE — Patient Instructions (Addendum)
Please call as Prozac refill needed; check on cost from [place]

## 2010-09-27 ENCOUNTER — Ambulatory Visit (INDEPENDENT_AMBULATORY_CARE_PROVIDER_SITE_OTHER): Payer: BC Managed Care – PPO | Admitting: Internal Medicine

## 2010-09-27 VITALS — BP 122/74 | HR 75 | Temp 98.4°F

## 2010-09-27 DIAGNOSIS — M5417 Radiculopathy, lumbosacral region: Secondary | ICD-10-CM

## 2010-09-27 DIAGNOSIS — R05 Cough: Secondary | ICD-10-CM

## 2010-09-27 DIAGNOSIS — M5414 Radiculopathy, thoracic region: Secondary | ICD-10-CM

## 2010-09-27 DIAGNOSIS — R5381 Other malaise: Secondary | ICD-10-CM

## 2010-09-27 DIAGNOSIS — IMO0002 Reserved for concepts with insufficient information to code with codable children: Secondary | ICD-10-CM

## 2010-09-27 DIAGNOSIS — R5383 Other fatigue: Secondary | ICD-10-CM

## 2010-09-27 DIAGNOSIS — R51 Headache: Secondary | ICD-10-CM

## 2010-09-27 DIAGNOSIS — H532 Diplopia: Secondary | ICD-10-CM

## 2010-09-27 MED ORDER — FLUTICASONE PROPIONATE 50 MCG/ACT NA SUSP: 1.0000 | NASAL | Status: DC

## 2010-09-27 NOTE — Patient Instructions (Signed)
Please keep a diary of your headaches . Document  each occurrence on the calendar with notation of : #1 any prodrome ( any non headache symptom such as marked fatigue,visual changes, ,etc ) which precedes actual headache ; #2) severity on 1-10 scale; #3) any triggers ( food/ drink,enviromenntal or weather changes ,physical or emotional stress) in 8-12 hour period prior to the headache; & #4) response to any medications or other intervention. Please review "Headache" @ [facility] for additional information.   Make an appointment to see your Ophthalmologist as soon as possible to evaluate the diplopia or double vision.

## 2010-09-27 NOTE — Progress Notes (Signed)
Subjective:    Patient ID: [name], female    DOB: 09-07-1950, 60 y.o.   MRN: [id number]  HPI FATIGUE: Onset: "forever", worse with cancer Rx   Fatigue @ rest : unsure   with exertion: yes   Primarily motivational fatigue or primarily physical fatigue: some of both Symptoms: Fever/ chills : no  Night sweats: no                                                                                            Vision changes ( blurred/ double/ loss): only diplopia  ; last Ophth exam 3 yrs ago                                                                                               Hoarseness or swallowing dysfunction: not consistently                                                                                         Bowel changes( constipation/ diarrhea): watery stool ( note :on Mg++  in 2 supplements)                                                                                  Weight change: yes, up 20# over 24 mos   Exertional chest pain: no  Dyspnea on exertion: no  Cough: yes, intermittent "pale yellow hunks of phlegm"  Hemoptysis: no New medications: no  Leg swelling: yes, ankles  PND: no  Melena/ rectal bleeding: yes, hemorrhoidal ; last colonoscopy 4/12 :Tics  Adenopathy: no  Severe snoring: no  Daytime sleepiness: no  Skin / hair / nail changes: yes, itchy skin  Temperature intolerance( heat/ cold) : heat intolerance  Feeling depressed: no, controlled with Prozac  Anhedonia: no  Altered appetite: no  Poor sleep/ Apnea : no  Abnormal bruising / bleeding or enlarged lymph nodes: no                                                                          PMH/ FH of thyroid disease: thyroid supplement from Dr Alessandra Bevels    #2 HEADACHE: Onset: couple mos ago   Location: frontal  Quality: dull Frequency: 2X/ week  Precipitating factors: no  Prior treatment: ASA  if > "2" Associated Symptoms Nausea/vomiting: no  Photophobia/phonophobia: no  Tearing of eyes: yes, unrelated to ha  Sinus pain/pressure: no  Family hx migraine: no ; PMH of migraines with BCP [place] Neck pain/stiffness: no  Focal weakness/numbness: no  Altered mental status: no  Trauma: no  New type of headache: no  Anticoagulant use: no  H/o cancer:  appt with Dr Darrold Span  9/19   Review of Systems  She experiences sharp left thigh pain approximately once a week usually occurring at rest. There no specific triggers for this  She's also had sharp pain in the midaxillary area on the right below the breast approximately once a week. This is also sharp and has no specific triggers, also occurring @ rest.  She denies hematuria, pyuria, or dysuria  She's had some hearing loss and chronic tinnitus unrelated to headaches.  She believes the cough is related to allergy; she describes  itchy eyes and sneezing. She states a chest x-ray in June was normal.She uses OTC Loratidine as needed     Objective:   Physical Exam Gen.: Healthy and well-nourished in appearance. Alert, appropriate and cooperative throughout exam. Head: Normocephalic without obvious abnormalities;  no alopecia  Eyes: No corneal or conjunctival inflammation noted. Pupils equal round reactive to light and accommodation. FOV WNL. Extraocular motion intact. Vision grossly normal with lenses. Ears: External  ear exam reveals no significant lesions or deformities. Canals clear .TMsdull. Hearing is grossly normal bilaterally to whisper @ 6 ft. Nose: External nasal exam reveals no deformity or inflammation. Nasal mucosa are pink and moist. No lesions or exudates noted.  Mouth: Oral mucosa and oropharynx reveal no lesions or exudates. Teeth in good repair. Neck: No deformities, masses, or tenderness noted. Range of motion &. Thyroid  normal. Lungs: Normal respiratory effort; chest expands symmetrically. Lungs are clear to  auscultation without rales, wheezes, or increased work of breathing. Heart: Normal rate and rhythm. Normal S1 and S2. No gallop, click, or rub. S4 ; no murmur. Abdomen: Bowel sounds normal; abdomen soft and nontender. No masses, organomegaly or hernias noted.                                                                             Musculoskeletal/extremities: No deformity or scoliosis noted of  the thoracic or lumbar spine. No clubbing, cyanosis, edema, or deformity noted. Range of motion  normal .  Tone & strength  normal.Joints normal. Nail health  good. Vascular: Carotid, radial artery, dorsalis pedis and  posterior tibial pulses are full and equal. No bruits present. Neurologic: Alert and oriented x3. Deep tendon reflexes symmetrical and 1/2+. Cranial nerve exam unremarkable. Gait normal. Romberg and finger to nose testing essentially normal; mild intention  tremor present.          Skin: Intact without suspicious lesions or rashes. Lymph: No cervical, axillary  lymphadenopathy present. Psych: Mood and affect are normal. Normally interactive                                                                                         Assessment & Plan:  #1 diplopia without significant swallowing dysfunction; formal ophthalmologic exam overdue  #2 headache, frontal; no neurologic deficits present clinically  #3 pain right lateral thoracic area, possible thoracic radicular pain  #4 pain left thigh, possible lumbosacral radicular pain  #5 fatigue, chronic  #6 cough with  intermittent sputum production  Plan: #1 formal ophthalmologic evaluation is indicated. Serologic studies for Myasthenia will be performed  #2 headache diary will be recommended  #3 and #4 imaging of the thoracic and lumbar spine would be recommended if symptoms persist or progress. No neurologic or neuromuscular deficits noted at this time  #5 if evaluation is negative, consideration of chronic fatigue evaluation by  a specialist in this area  #6 chest x-ray &  pulmonary  function tests may be indicated if the cough persists.  Extensive labs 04/19/10 were reviewed. There were no significant abnormalities. Chest x-ray 07/18/10 revealed no evidence of any acute process. Copies of reports were given to the patient.

## 2010-10-21 ENCOUNTER — Other Ambulatory Visit: Payer: Self-pay | Admitting: Oncology

## 2010-10-21 ENCOUNTER — Encounter (HOSPITAL_BASED_OUTPATIENT_CLINIC_OR_DEPARTMENT_OTHER): Payer: BC Managed Care – PPO | Admitting: Oncology

## 2010-10-21 DIAGNOSIS — C549 Malignant neoplasm of corpus uteri, unspecified: Secondary | ICD-10-CM

## 2010-10-21 DIAGNOSIS — Z1231 Encounter for screening mammogram for malignant neoplasm of breast: Secondary | ICD-10-CM

## 2010-10-21 DIAGNOSIS — R197 Diarrhea, unspecified: Secondary | ICD-10-CM

## 2010-10-21 LAB — CBC WITH DIFFERENTIAL/PLATELET
BASO%: 0.3 % (ref 0.0–2.0)
LYMPH%: 21.8 % (ref 14.0–49.7)
MCHC: 35.3 g/dL (ref 31.5–36.0)
MCV: 92.4 fL (ref 79.5–101.0)
MONO%: 6.8 % (ref 0.0–14.0)
Platelets: 227 10*3/uL (ref 145–400)
RBC: 4.35 10*6/uL (ref 3.70–5.45)
WBC: 5.3 10*3/uL (ref 3.9–10.3)

## 2010-10-21 LAB — COMPREHENSIVE METABOLIC PANEL
ALT: 20 U/L (ref 0–35)
Alkaline Phosphatase: 55 U/L (ref 39–117)
Sodium: 139 mEq/L (ref 135–145)
Total Bilirubin: 0.7 mg/dL (ref 0.3–1.2)
Total Protein: 6.7 g/dL (ref 6.0–8.3)

## 2010-11-07 ENCOUNTER — Other Ambulatory Visit: Payer: Self-pay | Admitting: Internal Medicine

## 2011-01-03 ENCOUNTER — Ambulatory Visit
Admission: RE | Admit: 2011-01-03 | Discharge: 2011-01-03 | Disposition: A | Discharge status: Home / Self Care | Payer: BC Managed Care – PPO | Source: Ambulatory Visit | Attending: Oncology | Admitting: Oncology

## 2011-01-03 DIAGNOSIS — Z1231 Encounter for screening mammogram for malignant neoplasm of breast: Secondary | ICD-10-CM

## 2011-01-11 ENCOUNTER — Ambulatory Visit: Payer: BC Managed Care – PPO | Attending: Gynecologic Oncology | Admitting: Gynecologic Oncology

## 2011-01-11 ENCOUNTER — Other Ambulatory Visit (HOSPITAL_COMMUNITY)
Admission: RE | Admit: 2011-01-11 | Discharge: 2011-01-11 | Disposition: A | Discharge status: Home / Self Care | Payer: BC Managed Care – PPO | Source: Ambulatory Visit | Attending: Gynecologic Oncology | Admitting: Gynecologic Oncology

## 2011-01-11 ENCOUNTER — Encounter: Payer: Self-pay | Admitting: Gynecologic Oncology

## 2011-01-11 VITALS — BP 126/80 | HR 62 | Temp 97.9°F | Resp 16 | Ht 69.17 in | Wt 202.1 lb

## 2011-01-11 DIAGNOSIS — C541 Malignant neoplasm of endometrium: Secondary | ICD-10-CM

## 2011-01-11 DIAGNOSIS — E039 Hypothyroidism, unspecified: Secondary | ICD-10-CM | POA: Insufficient documentation

## 2011-01-11 DIAGNOSIS — C549 Malignant neoplasm of corpus uteri, unspecified: Secondary | ICD-10-CM | POA: Insufficient documentation

## 2011-01-11 DIAGNOSIS — K219 Gastro-esophageal reflux disease without esophagitis: Secondary | ICD-10-CM | POA: Insufficient documentation

## 2011-01-11 DIAGNOSIS — Z79899 Other long term (current) drug therapy: Secondary | ICD-10-CM | POA: Insufficient documentation

## 2011-01-11 DIAGNOSIS — Z01419 Encounter for gynecological examination (general) (routine) without abnormal findings: Secondary | ICD-10-CM | POA: Insufficient documentation

## 2011-01-11 NOTE — Patient Instructions (Signed)
RTC in 6 months.

## 2011-01-11 NOTE — Progress Notes (Signed)
Addended by: Randel Pigg on: 01/11/2011 10:06 AM   Modules accepted: Orders

## 2011-01-11 NOTE — Progress Notes (Signed)
Consult Note: Gyn-Onc  [name] 60 y.o. female  CC: No chief complaint on file.   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 She underwent laparoscopic hysterectomy, BSO and appropriate  staging at that time. Pathology revealed a grade 2 endometrioid  adenocarcinoma with LVSI. She had bilateral pelvic lymph nodes, 2 out of 2 on the left and 3 out of 3 on the right that were positive. Her  pelvic lymph nodes were negative. She completed chemotherapy and  radiation in a sandwich type fashion in November 2010. She had post-  treatment CT scan in December 2010 and June 2011 that revealed no  evidence of recurrent disease. I last saw her in Jume of 2012 at  which time her exam and Pap smear were negative. She has seen Dr. Darrold Span in the interim with negative lab work.  She comes in today for  followup. She is overall doing quite well and denies any significant  complaints. She has had consistent issues with diarrhea so she takes  Imodium and cholestyramine every day. She had about 4 formed bowel  movements per day but because of the diarrhea had developed  an anal fissure, she had a colonoscopy in April of 2012 that was  otherwise negative. About twice a month she will have an occasional  sharp fleeting pain in the abdomen. It never wakes her up at night.  She started taking Armour Thyroid in May of 2012. She states she is  feeling better. Her energy level is improved. She does feel "hotter"  but her mood is up and she is very pleased with that. She is otherwise enjoying a good quality of life and continues to stay busy with work. 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  She does have some vaginal dryness and does feel that she wakes up in the morning "sore" as she is scratching at night in her sleep.  HEALTH MAINTENANCE: She is up-to-date on her mammograms.    FAMILY HISTORY: There are no new medical problems.      Review of Systems: 10 point ROS negative. Current Meds:  Outpatient Encounter Prescriptions as of 01/11/2011  Medication Sig Dispense Refill  . AMBULATORY NON FORMULARY MEDICATION Multiple Supplements       . Coenzyme Q10 (COQ10) 200 MG CAPS Take by mouth. 1 by mouth 4-5 x weekly       . fluticasone (FLONASE) 50 MCG/ACT nasal spray Place 1 spray into the nose as directed. 1 spray in each nostril twice a day as needed. Use the "crossover" technique as discussed   16 g  2  . liothyronine (CYTOMEL) 25 MCG tablet Take 25 mcg by mouth daily.        Marland Kitchen loperamide (IMODIUM) 2 MG capsule Take 2 mg by mouth. 1-4 x daily       . Omega-3 Fatty Acids (FISH OIL PO) Take by mouth. 2 by mouth three times daily       . PROZAC 20 MG capsule TAKE ONE CAPSULE EACH DAY  90 capsule  2  . thyroid (ARMOUR) 30 MG tablet Take 30 mg by mouth. 2 by mouth daily         Allergy:  Allergies  Allergen Reactions  . Levofloxacin     REACTION: Rash (urticaria)    Social Hx:   History   Social History  . Marital Status: Married  Spouse Name: N/A    Number of Children: N/A  . Years of Education: N/A   Occupational History  . Not on file.   Social History Main Topics  . Smoking status: Former Smoker    Quit date: 01/30/1993  . Smokeless tobacco: Not on file  . Alcohol Use: Yes  . Drug Use: No  . Sexually Active: Not on file   Other Topics Concern  . Not on file   Social History Narrative  . No narrative on file    Past Surgical Hx:  Past Surgical History  Procedure Date  . Total abdominal hysterectomy w/ bilateral salpingoophorectomy 2010    Past Medical Hx:  Past Medical History  Diagnosis Date  . History of uterine cancer   . Allergic rhinitis   . GERD (gastroesophageal reflux disease)   . Hypothyroidism     Cytomel Rx'ed by Dr.Vaughan    Family Hx:  Family History  Problem Relation Age of Onset  . Alcohol abuse Father    . Alcohol abuse Mother   . Arthritis Mother   . Stroke Mother   . Diabetes Mother     Vitals:  There were no vitals taken for this visit.  Physical Exam:  GENERAL: Well-nourished, well-developed female, in no acute distress.  NECK: Supple. There is no lymphadenopathy, no thyromegaly.  LUNGS: Clear to auscultation bilaterally.  CARDIOVASCULAR EXAM: Regular rate and rhythm.  ABDOMEN: Soft, nontender, non-distended with no palpable mass or  hepatosplenomegaly. Exam is somewhat limited by habitus. Groins are  negative for adenopathy.  EXTREMITIES: There is 1 to 2+ nonpitting edema equal bilaterally.  PELVIC: External genitalia is within normal limits. Vagina is markedly  atrophic. There is evidence of vulvitis due to scratching in the area of the labia minor, no visible lesions.The vaginal cuff is visualized. There are no visible  lesions. ThinPrep Pap was submitted without difficulty,. Bimanual  examination reveals no masses or nodularity. Rectal confirms.   Assessment/Plan: A 60 year old with stage 3C endometrioid adenocarcinoma  diagnosed and treated in 2010 who is at about her 3-year anniversary,  clinically has no evidence of recurrent disease. We did discuss the  role of imaging and the patient does not wish to have a CT scan due to  concerns about radiation exposure. We will follow up on results from  Pap smear from today. She will return to see Dr. Darrold Span in 3 months  and return to see [place] in 6 months. She will try some olive oil to the perineum to see if this helps to relieve her symptoms.  Doroteo Nickolson A., MD 01/11/2011, 9:25 AM

## 2011-01-15 NOTE — Progress Notes (Signed)
This was not an encounter by L.Darrold Span, MD  NO CHARGE  (level 1 charge put in to close encounter only, but NO CHARGE)

## 2011-01-19 ENCOUNTER — Telehealth: Payer: Self-pay | Admitting: *Deleted

## 2011-01-19 NOTE — Telephone Encounter (Signed)
Notified patient that the pap smear that was  was negative.

## 2011-02-14 ENCOUNTER — Telehealth: Payer: Self-pay | Admitting: Internal Medicine

## 2011-02-14 NOTE — Telephone Encounter (Signed)
Patient states that she wants to start seeing Dr. Laury Axon. She wants a female physician. Is this okay?

## 2011-02-14 NOTE — Telephone Encounter (Signed)
OK 

## 2011-02-15 NOTE — Telephone Encounter (Signed)
Made appt for 03/22/11 with Dr. Laury Axon

## 2011-03-04 ENCOUNTER — Telehealth: Payer: Self-pay | Admitting: Oncology

## 2011-03-04 NOTE — Telephone Encounter (Signed)
L/m and mailed 03/2011 appt  aom

## 2011-03-22 ENCOUNTER — Encounter: Payer: Self-pay | Admitting: Family Medicine

## 2011-03-22 ENCOUNTER — Ambulatory Visit (INDEPENDENT_AMBULATORY_CARE_PROVIDER_SITE_OTHER): Payer: BC Managed Care – PPO | Admitting: Family Medicine

## 2011-03-22 VITALS — BP 124/78 | HR 84 | Temp 98.2°F | Wt 206.4 lb

## 2011-03-22 DIAGNOSIS — F419 Anxiety disorder, unspecified: Secondary | ICD-10-CM

## 2011-03-22 DIAGNOSIS — F32A Depression, unspecified: Secondary | ICD-10-CM | POA: Insufficient documentation

## 2011-03-22 DIAGNOSIS — Z780 Asymptomatic menopausal state: Secondary | ICD-10-CM

## 2011-03-22 DIAGNOSIS — F329 Major depressive disorder, single episode, unspecified: Secondary | ICD-10-CM

## 2011-03-22 DIAGNOSIS — G4700 Insomnia, unspecified: Secondary | ICD-10-CM

## 2011-03-22 DIAGNOSIS — F411 Generalized anxiety disorder: Secondary | ICD-10-CM

## 2011-03-22 DIAGNOSIS — Z0000 Encounter for general adult medical examination without abnormal findings: Secondary | ICD-10-CM

## 2011-03-22 DIAGNOSIS — Z8542 Personal history of malignant neoplasm of other parts of uterus: Secondary | ICD-10-CM

## 2011-03-22 DIAGNOSIS — E039 Hypothyroidism, unspecified: Secondary | ICD-10-CM

## 2011-03-22 LAB — HEPATIC FUNCTION PANEL
ALT: 32 U/L (ref 0–35)
Albumin: 4.2 g/dL (ref 3.5–5.2)
Alkaline Phosphatase: 67 U/L (ref 39–117)
Bilirubin, Direct: 0 mg/dL (ref 0.0–0.3)
Total Protein: 7.3 g/dL (ref 6.0–8.3)

## 2011-03-22 LAB — CBC WITH DIFFERENTIAL/PLATELET
Basophils Relative: 0.7 % (ref 0.0–3.0)
Eosinophils Absolute: 0.1 10*3/uL (ref 0.0–0.7)
Eosinophils Relative: 3.2 % (ref 0.0–5.0)
Hemoglobin: 14.9 g/dL (ref 12.0–15.0)
Lymphocytes Relative: 24.6 % (ref 12.0–46.0)
MCHC: 33.9 g/dL (ref 30.0–36.0)
MCV: 96.3 fl (ref 78.0–100.0)
Monocytes Absolute: 0.3 10*3/uL (ref 0.1–1.0)
Neutro Abs: 3 10*3/uL (ref 1.4–7.7)
Neutrophils Relative %: 64.1 % (ref 43.0–77.0)
RBC: 4.57 Mil/uL (ref 3.87–5.11)
WBC: 4.7 10*3/uL (ref 4.5–10.5)

## 2011-03-22 LAB — BASIC METABOLIC PANEL
CO2: 30 mEq/L (ref 19–32)
Chloride: 103 mEq/L (ref 96–112)
Creatinine, Ser: 0.6 mg/dL (ref 0.4–1.2)
Sodium: 141 mEq/L (ref 135–145)

## 2011-03-22 LAB — POCT URINALYSIS DIPSTICK
Bilirubin, UA: NEGATIVE
Glucose, UA: NEGATIVE
Ketones, UA: NEGATIVE
Leukocytes, UA: NEGATIVE
Spec Grav, UA: 1.025

## 2011-03-22 LAB — T4, FREE: Free T4: 0.7 ng/dL (ref 0.60–1.60)

## 2011-03-22 LAB — T3, FREE: T3, Free: 2.9 pg/mL (ref 2.3–4.2)

## 2011-03-22 LAB — TSH: TSH: 0.74 u[IU]/mL (ref 0.35–5.50)

## 2011-03-22 MED ORDER — CHOLECALCIFEROL 125 MCG (5000 UT) PO CAPS: 5000.0000 [IU] | ORAL_CAPSULE | Freq: Every day | ORAL | Status: AC

## 2011-03-22 MED ORDER — AMBULATORY NON FORMULARY MEDICATION: Status: DC

## 2011-03-22 MED ORDER — MELATONIN 3 MG PO CAPS: ORAL_CAPSULE | ORAL | Status: DC

## 2011-03-22 NOTE — Progress Notes (Signed)
  Subjective:    Patient ID: [name], female    DOB: 01-23-1951, 61 y.o.   MRN: [id number]  HPI Pt here to establish with me and have labs.   No complaints.   Pt needs refills.  Review of Systems    as above Objective:   Physical Exam  Constitutional: She is oriented to person, place, and time. She appears well-developed and well-nourished.  Cardiovascular: Normal rate, regular rhythm and normal heart sounds.   No murmur heard. Pulmonary/Chest: Effort normal and breath sounds normal. No respiratory distress. She has no wheezes. She has no rales.  Neurological: She is alert and oriented to person, place, and time.  Psychiatric: She has a normal mood and affect. Her behavior is normal. Judgment and thought content normal.          Assessment & Plan:

## 2011-03-22 NOTE — Patient Instructions (Signed)

## 2011-03-22 NOTE — Assessment & Plan Note (Signed)
Check labs con't meds 

## 2011-03-22 NOTE — Assessment & Plan Note (Signed)
Per onc  

## 2011-03-22 NOTE — Assessment & Plan Note (Signed)
con't meds 

## 2011-03-24 LAB — VITAMIN D 1,25 DIHYDROXY
Vitamin D 1, 25 (OH)2 Total: 93 pg/mL — ABNORMAL HIGH (ref 18–72)
Vitamin D3 1, 25 (OH)2: 93 pg/mL

## 2011-03-29 ENCOUNTER — Encounter: Payer: Self-pay | Admitting: Family Medicine

## 2011-03-29 IMAGING — CT CT ABDOMEN W/ CM
2 of 6 series · 16 of 46 positions shown, 18 images · IV contrast (agent unspecified)
Comparison: 08/21/2008

CT CHEST

CLINICAL DATA: Endometrial carcinoma

CT CHEST, ABDOMEN AND PELVIS WITH CONTRAST
TECHNIQUE: Multidetector CT imaging of the chest, abdomen and
pelvis was performed following the standard protocol during bolus
administration of intravenous contrast.
Contrast: 100 ml Amnipaque-YYY

[Series 2: cap with st · axial · 0.82mm/px · z∈[-506,+54]mm · 13 of 130 slices shown, 15 images]
[im 9/130  soft-tissue]
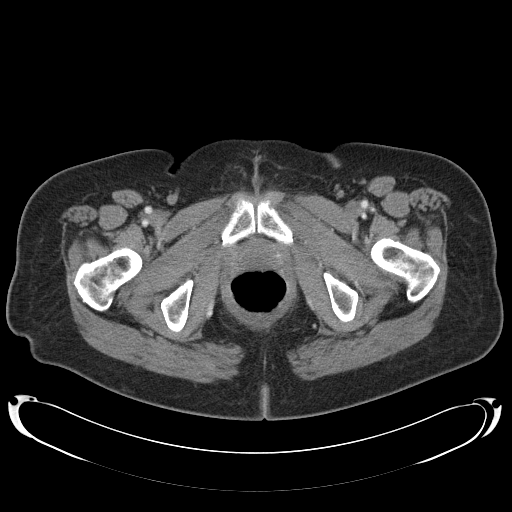
[im 9/130  bone]
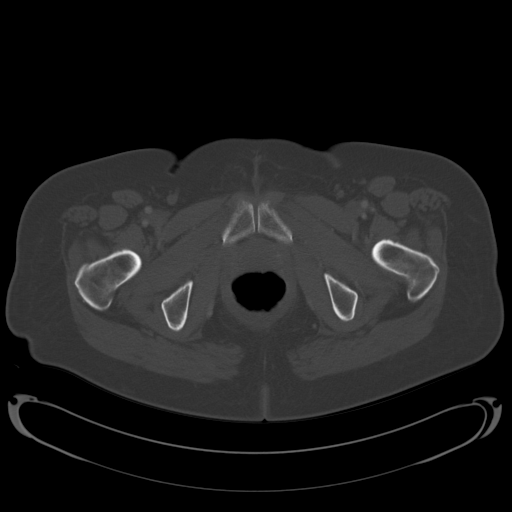
[im 17/130  soft-tissue]
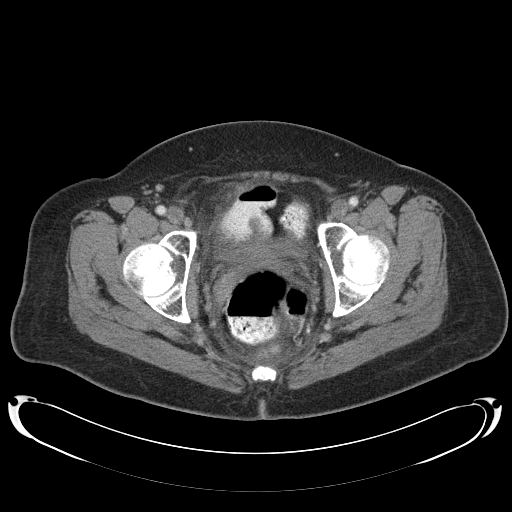
[im 25/130  soft-tissue]
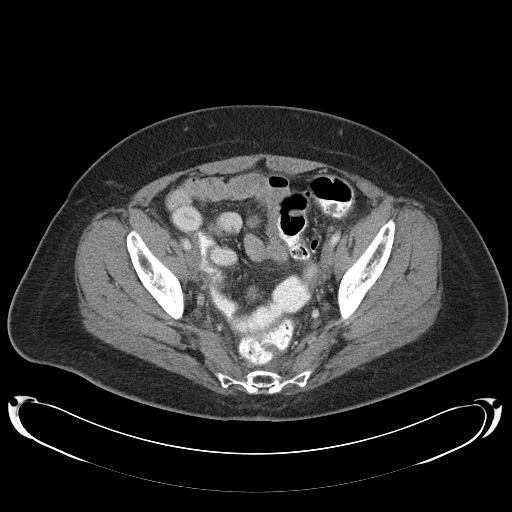
[im 41/130  soft-tissue]
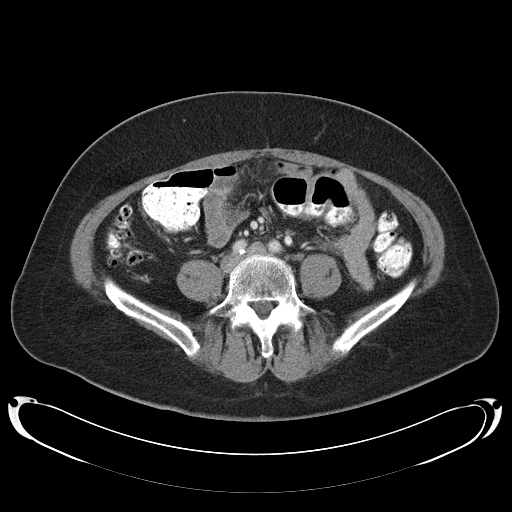
[im 49/130  soft-tissue]
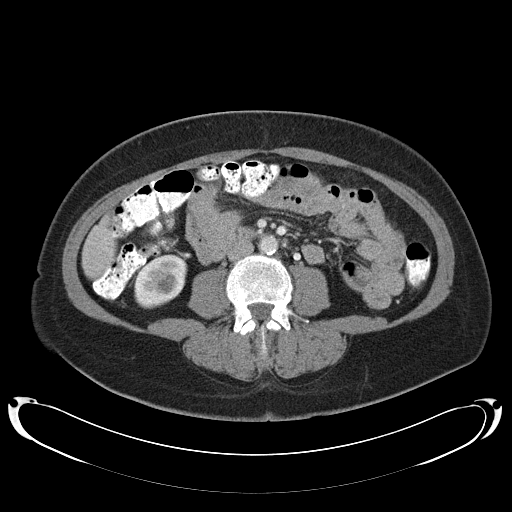
[im 57/130  soft-tissue]
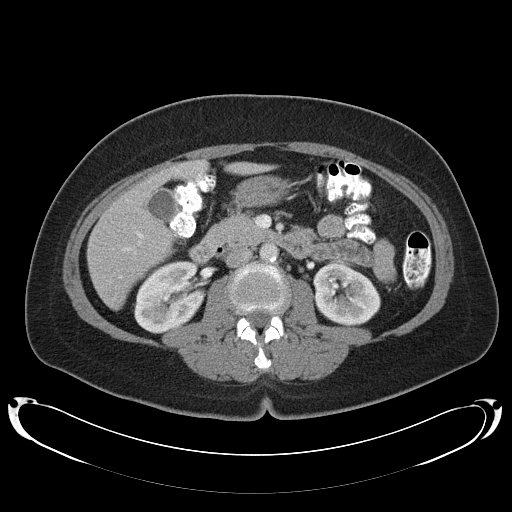
[im 65/130  soft-tissue]
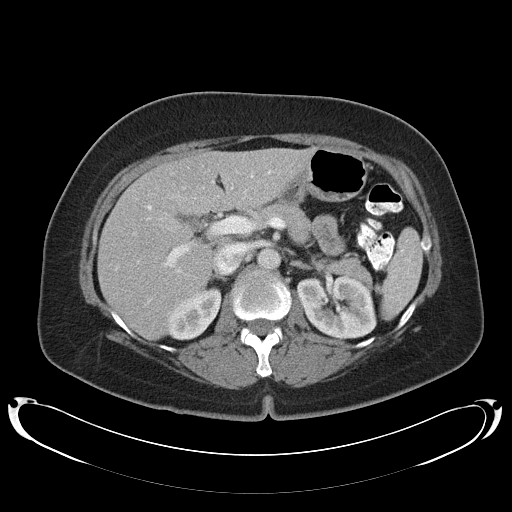
[im 73/130  soft-tissue]
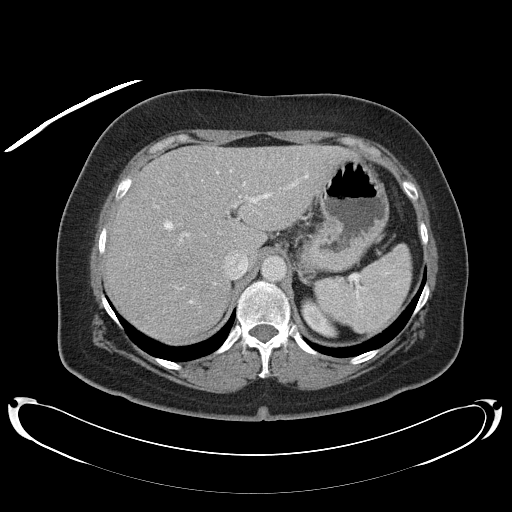
[im 81/130  soft-tissue]
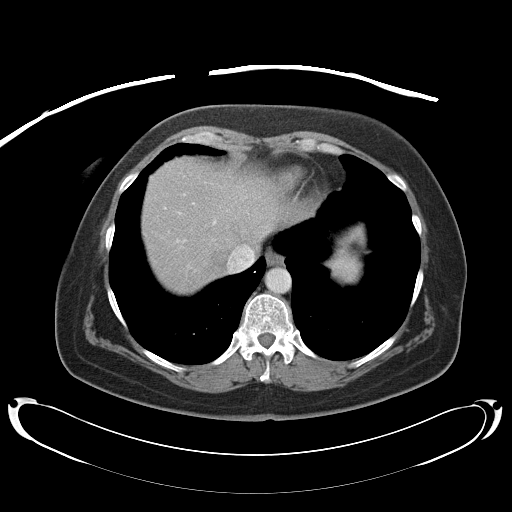
[im 81/130  bone]
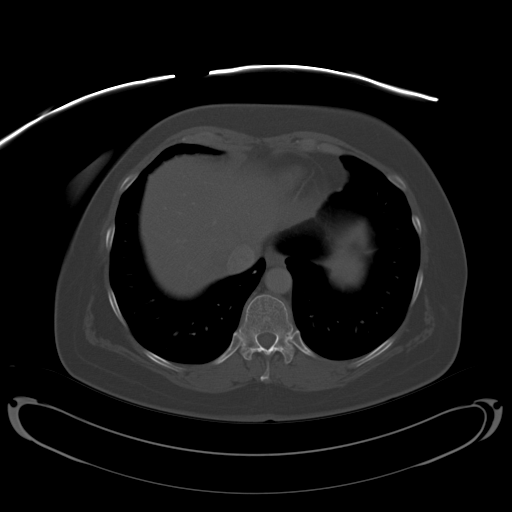
[im 89/130  soft-tissue]
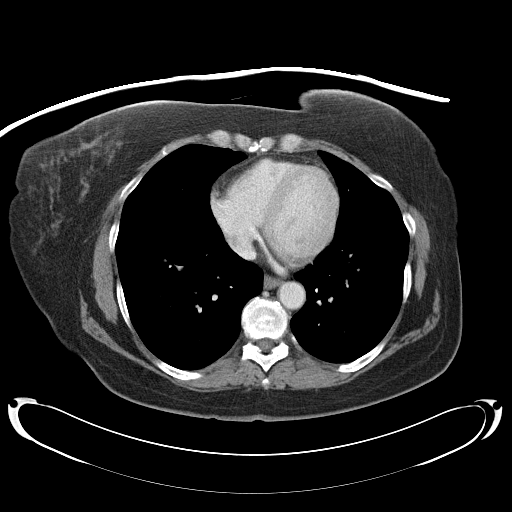
[im 105/130  soft-tissue]
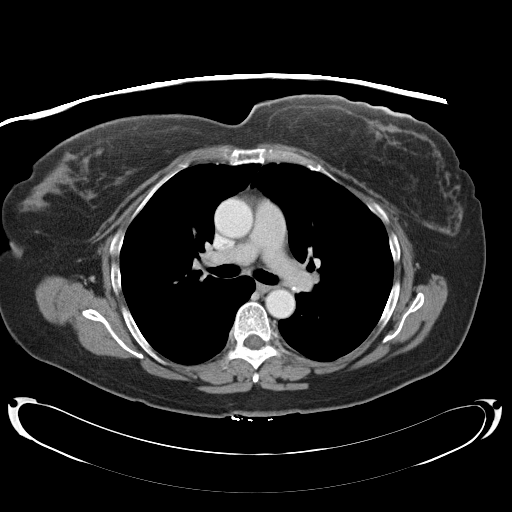
[im 113/130  soft-tissue]
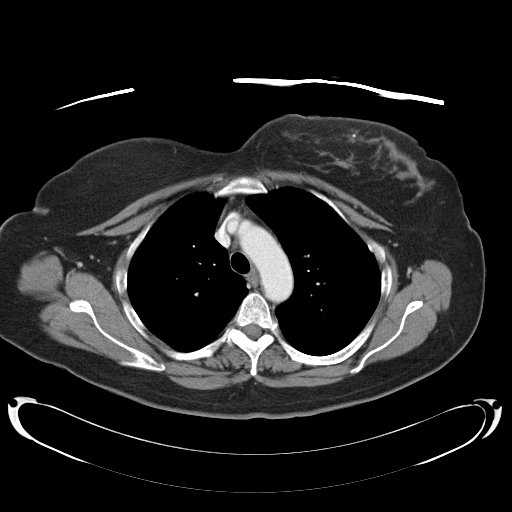
[im 121/130  soft-tissue]
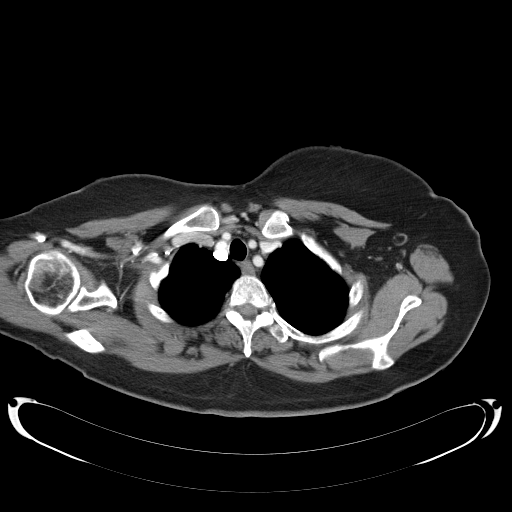

[Series 602: <mpr thick range> · coronal · 1.27mm/px · 3 of 85 slices shown]
[im 29/85  soft-tissue]
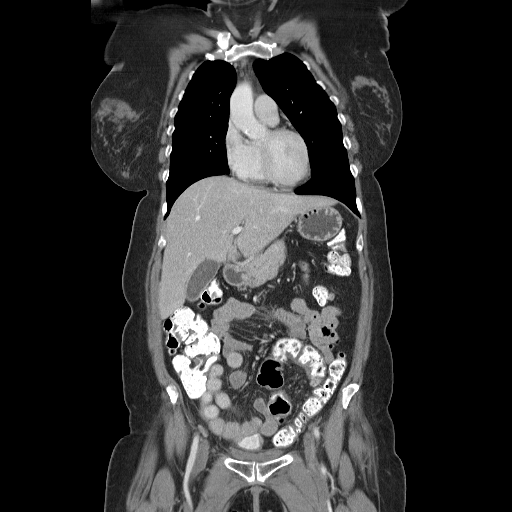
[im 38/85  soft-tissue]
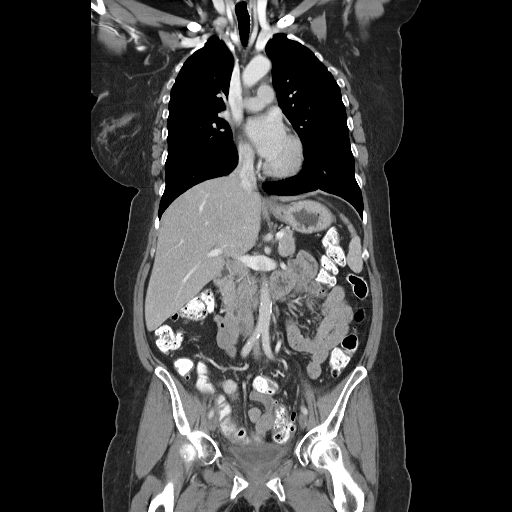
[im 47/85  soft-tissue]
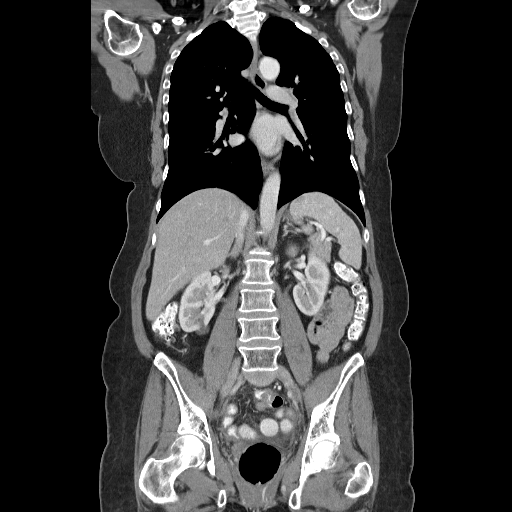

[16 of 46 positions shown; findings below may reference images not displayed]

FINDINGS: No axillary, mediastinal or hilar abnormal adenopathy
identified.  Calcified left infrahilar lymph nodes and  lower lobe
granulomatous disease noted.  Normal heart size.  No pericardial or
pleural effusion.  No hiatal hernia.

Lung windows demonstrate a stable 3-4 mm nodule in the left lower
lobe, image 46.  No evidence of interval enlargement.  No new
pulmonary nodule or mass identified.
IMPRESSION: No acute chest process.
Evidence of remote granulomatous disease
Stable 3-4 mm left lower lobe nonspecific nodule

CT ABDOMEN
FINDINGS: The liver, gallbladder, biliary system, adrenal glands,
pancreas, and left kidney are within normal limits for age.  Spleen
demonstrates punctate calcified granulomas throughout.  Right
kidney demonstrates a stable minimally complex cyst in the lower
pole measuring 2 cm, exophytic in configuration.

No bowel obstruction, dilatation, ileus pattern or central
mesenteric abnormality.  No free fluid, fluid collection, ascites
or omental abnormality.  Scattered colonic diverticulosis.
Atherosclerosis of the aorta without aneurysm.  No adenopathy.
IMPRESSION: No acute intra-abdominal finding or evidence of metastatic disease.
Stable CT exam of the abdomen.

CT PELVIS
FINDINGS: Previous hysterectomy noted.  No pelvic fluid collection,
hemorrhage, or distal bowel acute process.  Diverticulosis noted.
No inguinal hernia.  Negative for adenopathy.  Resolved left
adnexal cystic abnormality, previously described.  Stable
nonenlarged left external iliac lymph nodes.

Minimal deep pelvic mesenteric vascular congestion and peri rectal
mild edematous changes in the presacral space.  This is nonspecific
in appearance and may be related to radiation.

Degenerative lower lumbar spine changes.  No acute osseous finding.
IMPRESSION: Stable CT exam of the pelvis.  No acute intrapelvic process or
evidence of metastatic disease.
Previous hysterectomy
Resolved right adnexal cyst
Pelvic mesenteric vascular congestion and perirectal mild edematous
presacral changes, nonspecific but suspect radiation related.

## 2011-04-03 ENCOUNTER — Encounter: Payer: Self-pay | Admitting: Family Medicine

## 2011-04-03 DIAGNOSIS — E039 Hypothyroidism, unspecified: Secondary | ICD-10-CM

## 2011-04-03 MED ORDER — THYROID 60 MG PO TABS: 60.0000 mg | ORAL_TABLET | Freq: Every day | ORAL | Status: DC

## 2011-04-10 ENCOUNTER — Encounter: Payer: Self-pay | Admitting: Family Medicine

## 2011-04-11 ENCOUNTER — Telehealth: Payer: Self-pay

## 2011-04-11 ENCOUNTER — Ambulatory Visit: Payer: BC Managed Care – PPO | Admitting: Family Medicine

## 2011-04-11 NOTE — Telephone Encounter (Signed)
Patient left message on voicemail.  She stated that she can not come in today, unless it was between 12 and 1p.  She is available tomorrow after 12:30p if you need to see her.  She states she has the "antiviral" medication and just needs the okay to start it.  She does NOT have a rash currently, just the pain that is associated with the shingles, as she has had this twice in the past.  Please advise.

## 2011-04-11 NOTE — Telephone Encounter (Signed)
She needs to start antiviral now if she has it----should be seen if it does not improve or gets worse.

## 2011-04-11 NOTE — Telephone Encounter (Signed)
Patient made aware and voiced understanding, she agreed to start now and will follow up if no improvement.    KP

## 2011-04-14 ENCOUNTER — Encounter: Payer: Self-pay | Admitting: Family Medicine

## 2011-04-25 ENCOUNTER — Ambulatory Visit (HOSPITAL_BASED_OUTPATIENT_CLINIC_OR_DEPARTMENT_OTHER): Payer: BC Managed Care – PPO | Admitting: Oncology

## 2011-04-25 ENCOUNTER — Encounter: Payer: Self-pay | Admitting: Oncology

## 2011-04-25 ENCOUNTER — Other Ambulatory Visit (HOSPITAL_BASED_OUTPATIENT_CLINIC_OR_DEPARTMENT_OTHER): Payer: BC Managed Care – PPO

## 2011-04-25 ENCOUNTER — Telehealth: Payer: Self-pay | Admitting: Oncology

## 2011-04-25 VITALS — BP 139/81 | HR 77 | Temp 97.4°F | Ht 69.0 in | Wt 208.6 lb

## 2011-04-25 DIAGNOSIS — Z8542 Personal history of malignant neoplasm of other parts of uterus: Secondary | ICD-10-CM

## 2011-04-25 LAB — COMPREHENSIVE METABOLIC PANEL
ALT: 27 U/L (ref 0–35)
Alkaline Phosphatase: 60 U/L (ref 39–117)
CO2: 27 mEq/L (ref 19–32)
Creatinine, Ser: 0.63 mg/dL (ref 0.50–1.10)
Total Bilirubin: 0.6 mg/dL (ref 0.3–1.2)

## 2011-04-25 LAB — CBC WITH DIFFERENTIAL/PLATELET
BASO%: 0.5 % (ref 0.0–2.0)
HCT: 40.2 % (ref 34.8–46.6)
LYMPH%: 26.4 % (ref 14.0–49.7)
MCH: 33.4 pg (ref 25.1–34.0)
MCHC: 35.2 g/dL (ref 31.5–36.0)
MCV: 94.8 fL (ref 79.5–101.0)
MONO#: 0.4 10*3/uL (ref 0.1–0.9)
MONO%: 8.8 % (ref 0.0–14.0)
NEUT%: 61.1 % (ref 38.4–76.8)
Platelets: 249 10*3/uL (ref 145–400)
WBC: 4.3 10*3/uL (ref 3.9–10.3)

## 2011-04-25 NOTE — Progress Notes (Signed)
OFFICE PROGRESS NOTE Date of Visit 04-25-2011 Physicians: [name], [name], [name], [name] INTERVAL HISTORY:  Patient is seen, together with husband, in scheduled follow up of her history of IIIC grade 2 endometrial carcinoma, this treated with laparoscopic TAH/BSO and staging in May 2010 with LVSI and bilateral pelvic node involvement. She received 6 cycles of taxol/carboplatin given in sandwich fashion with RT, all completed Nov 2010. She has been followed since then with observation, without known active disease. Last CT AP was June 2012; last CXR also June 2012.She saw [name] last in Dec.with negative PAP.  Patient had mammograms at [facility] 01-04-2011 with no mammographic findings of concern. She is now seeing [name] as primary.   Review of Systems: no abdominal or pelvic pain, no change in some diarrhea which she has had since RT, this controlled with imodium. No bleeding, no recent fever or symptoms of infection. No respiratory or cardiac symptoms. Appetite at baseline. Sleeps poorly despite melatonin, which we have discussed. Remainder of 10 point Review of Systems negative/ unchanged.  Objective:  Vital signs in last 24 hours:  BP 139/81  Pulse 77  Temp(Src) 97.4 F (36.3 C) (Oral)  Ht 5\' 9"  (1.753 m)  Wt 208 lb 9.6 oz (94.62 kg)  BMI 30.80 kg/m2 Weight is up 9.5 lbs since Sept. Alert, easily mobile, looks comfortable. HEENT:mucous membranes moist, pharynx normal without lesions. Normal hair pattern. PERRL, not icteric LymphaticsCervical, supraclavicular, and axillary nodes normal.No inguinal adenopathy Resp: clear to auscultation bilaterally and normal percussion bilaterally Cardio: regular rate and rhythm GI: soft, non-tender; bowel sounds normal; no masses,  no organomegaly, not distended. Breasts without dominant masses, skin or nipple findings, axillae benign. Extremities: without edema, cords, tenderness Neuro:nonfocal  Lab Results:   Basename  04/25/11 1040  WBC 4.3  HGB 14.1  HCT 40.2  PLT 249   ANC 2.6 BMET Cmet resulted after visit entirely normal. Studies/Results:  No results found.  Medications: I have reviewed the patient's current medications. I have suggested taking the melatonin qhs for a few weeks rather than present prn.  Assessment/Plan: 1. IIIC endometrial carcinoma: history and treatment as above, clinically continuing to do well. She will see [name] in June and I will see her in Dec or sooner if needed. 2.radiation colitis with residual diarrhea 3.chronic sleep difficulty Patient and husband had questions answered to their satisfaction and were in agreement with plan.  Lauralei Clouse P, MD   04/25/2011, 11:34 AM

## 2011-04-25 NOTE — Patient Instructions (Signed)
Consider melatonin nightly for a couple of weeks.  Posture, massage for upper back and shoulders

## 2011-04-25 NOTE — Telephone Encounter (Signed)
gV PT APPT FOR [id number]

## 2011-07-05 ENCOUNTER — Encounter: Payer: Self-pay | Admitting: Gynecologic Oncology

## 2011-07-05 ENCOUNTER — Ambulatory Visit: Payer: BC Managed Care – PPO | Attending: Gynecologic Oncology | Admitting: Gynecologic Oncology

## 2011-07-05 ENCOUNTER — Other Ambulatory Visit (HOSPITAL_COMMUNITY)
Admission: RE | Admit: 2011-07-05 | Discharge: 2011-07-05 | Disposition: A | Discharge status: Home / Self Care | Payer: BC Managed Care – PPO | Source: Ambulatory Visit | Attending: Gynecologic Oncology | Admitting: Gynecologic Oncology

## 2011-07-05 ENCOUNTER — Ambulatory Visit (HOSPITAL_BASED_OUTPATIENT_CLINIC_OR_DEPARTMENT_OTHER): Payer: BC Managed Care – PPO

## 2011-07-05 VITALS — BP 138/78 | HR 72 | Temp 97.8°F | Resp 18 | Ht 66.0 in | Wt 210.4 lb

## 2011-07-05 DIAGNOSIS — Z9221 Personal history of antineoplastic chemotherapy: Secondary | ICD-10-CM | POA: Insufficient documentation

## 2011-07-05 DIAGNOSIS — R635 Abnormal weight gain: Secondary | ICD-10-CM | POA: Insufficient documentation

## 2011-07-05 DIAGNOSIS — Z923 Personal history of irradiation: Secondary | ICD-10-CM | POA: Insufficient documentation

## 2011-07-05 DIAGNOSIS — Z823 Family history of stroke: Secondary | ICD-10-CM | POA: Insufficient documentation

## 2011-07-05 DIAGNOSIS — Z90710 Acquired absence of both cervix and uterus: Secondary | ICD-10-CM | POA: Insufficient documentation

## 2011-07-05 DIAGNOSIS — Z8489 Family history of other specified conditions: Secondary | ICD-10-CM | POA: Insufficient documentation

## 2011-07-05 DIAGNOSIS — K219 Gastro-esophageal reflux disease without esophagitis: Secondary | ICD-10-CM | POA: Insufficient documentation

## 2011-07-05 DIAGNOSIS — C541 Malignant neoplasm of endometrium: Secondary | ICD-10-CM

## 2011-07-05 DIAGNOSIS — Z01419 Encounter for gynecological examination (general) (routine) without abnormal findings: Secondary | ICD-10-CM | POA: Insufficient documentation

## 2011-07-05 DIAGNOSIS — Z8542 Personal history of malignant neoplasm of other parts of uterus: Secondary | ICD-10-CM | POA: Insufficient documentation

## 2011-07-05 DIAGNOSIS — Z124 Encounter for screening for malignant neoplasm of cervix: Secondary | ICD-10-CM | POA: Insufficient documentation

## 2011-07-05 DIAGNOSIS — Z800 Family history of malignant neoplasm of digestive organs: Secondary | ICD-10-CM | POA: Insufficient documentation

## 2011-07-05 DIAGNOSIS — Z833 Family history of diabetes mellitus: Secondary | ICD-10-CM | POA: Insufficient documentation

## 2011-07-05 DIAGNOSIS — F329 Major depressive disorder, single episode, unspecified: Secondary | ICD-10-CM | POA: Insufficient documentation

## 2011-07-05 DIAGNOSIS — Z87891 Personal history of nicotine dependence: Secondary | ICD-10-CM | POA: Insufficient documentation

## 2011-07-05 DIAGNOSIS — F3289 Other specified depressive episodes: Secondary | ICD-10-CM | POA: Insufficient documentation

## 2011-07-05 DIAGNOSIS — E039 Hypothyroidism, unspecified: Secondary | ICD-10-CM | POA: Insufficient documentation

## 2011-07-05 LAB — CREATININE, SERUM: Creatinine, Ser: 0.62 mg/dL (ref 0.50–1.10)

## 2011-07-05 LAB — BUN: BUN: 15 mg/dL (ref 6–23)

## 2011-07-05 NOTE — Progress Notes (Signed)
Consult Note: Gyn-Onc  [name] 61 y.o. female  CC:  Chief Complaint  Patient presents with  . Endometrial cancer    Follow up    HPI: [name] is a very pleasant 61 year old with a stage IIIC endometrial carcinoma that was diagnosed in May 2010. She underwent laparoscopic hysterectomy, BSO and appropriate staging at that time. Pathology revealed a grade 2 endometrioid adenocarcinoma with LVSI. She had bilateral pelvic lymph nodes, 2 out of 2 on the left and 3 out of 3 on the right that were positive. Her pelvic lymph nodes were negative. She completed chemotherapy and radiation in a sandwich type fashion in November 2010. She had post- treatment CT scan in December 2010 and June 2011 that revealed no evidence of recurrent disease. I last saw her in Dec. of 2012 at which time her exam and Pap smear were negative. She has seen Dr. Darrold Span in the interim (3/13) with negative lab work.   HEALTH MAINTENANCE: She is up-to-date on her mammograms.   FAMILY HISTORY: There are no new medical problems.   Interval History:  She comes in today for followup. She is overall doing quite well and denies any significant complaints. She has had occassional issues with diarrhea so she takes Imodium prn. She had a colonoscopy in April of 2012 that was otherwise negative. About twice a month she will have an occasional sharp fleeting pain in the abdomen. It never wakes her up at night. She started taking Armour Thyroid in May of 2012. She states she is feeling better. Her energy level is improved. She does feel "hotter" but her mood is up and she is very pleased with that. She is otherwise enjoying a good quality of life and continues to stay busy with work. 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   Review of Systems: She has gained about 16 pounds this time last year. She states that she probably could decrease her portion sizes as well as exercise more. We discussed at some length. She  occasionally has some swollen ankles but occasionally that seems to be worse when she is up and about quite a bit. 10 point review of systems is otherwise negative.  Current Meds:  Outpatient Encounter Prescriptions as of 07/05/2011  Medication Sig Dispense Refill  . AMBULATORY NON FORMULARY MEDICATION Multiple Supplements      . AMBULATORY NON FORMULARY MEDICATION Medication Name: reservatrol 2 po qd      . Cholecalciferol (PA VITAMIN D-3) 5000 UNITS capsule Take 1 capsule (5,000 Units total) by mouth daily.  30 capsule  0  . Coenzyme Q10 (COQ10) 200 MG CAPS Take by mouth. 1 by mouth 4-5 x weekly       . loperamide (IMODIUM) 2 MG capsule Take 2 mg by mouth. 1-4 x daily       . Melatonin 3 MG CAPS 2-3 po qhs    0  . Omega-3 Fatty Acids (FISH OIL PO) Take by mouth. 2 by mouth three times daily       . PROZAC 20 MG capsule TAKE ONE CAPSULE EACH DAY  90 capsule  2  . thyroid (ARMOUR) 60 MG tablet Take 1 tablet (60 mg total) by mouth daily.  90 tablet  3  . fluticasone (FLONASE) 50 MCG/ACT nasal spray Place 1 spray into the nose as directed. 1 spray in each nostril twice a day as needed. Use the "crossover" technique as discussed   16 g  2    Allergy:  Allergies  Allergen  Reactions  . Levofloxacin     REACTION: Rash (urticaria)    Social Hx:   History   Social History  . Marital Status: Married    Spouse Name: N/A    Number of Children: N/A  . Years of Education: N/A   Occupational History  . counselor    Social History Main Topics  . Smoking status: Former Smoker -- 1.0 packs/day for 25 years    Types: Cigarettes    Quit date: 01/30/1993  . Smokeless tobacco: Not on file  . Alcohol Use: 8.4 oz/week    14 Cans of beer per week  . Drug Use: No  . Sexually Active: No   Other Topics Concern  . Not on file   Social History Narrative  . No narrative on file    Past Surgical Hx:  Past Surgical History  Procedure Date  . Total abdominal hysterectomy w/ bilateral  salpingoophorectomy 2010  . Abdominal hysterectomy 04/2008    TAH BSO    Past Medical Hx:  Past Medical History  Diagnosis Date  . History of uterine cancer   . Allergic rhinitis   . GERD (gastroesophageal reflux disease)   . Hypothyroidism     Cytomel Rx'ed by Dr.Vaughan  . Depression   . Cancer 04/2008    uterine    Family Hx:  Family History  Problem Relation Age of Onset  . Alcohol abuse Father   . Alcohol abuse Mother   . Arthritis Mother   . Stroke Mother   . Diabetes Mother   . Cancer Paternal Grandfather     stomach    Vitals:  Blood pressure 138/78, pulse 72, temperature 97.8 F (36.6 C), temperature source Oral, resp. rate 18, height 5\' 6"  (1.676 m), weight 210 lb 6.4 oz (95.437 kg).  Physical Exam: GENERAL: Well-nourished, well-developed female, in no acute distress.   NECK: Supple. There is no lymphadenopathy, no thyromegaly.   LUNGS: Clear to auscultation bilaterally.   CARDIOVASCULAR EXAM: Regular rate and rhythm.   ABDOMEN: Soft, nontender, non-distended with no palpable mass or hepatosplenomegaly. Exam is somewhat limited by habitus. Groins are negative for adenopathy.   EXTREMITIES: There is 1 to 2+ nonpitting edema equal bilaterally.   PELVIC: External genitalia is within normal limits. Vagina is markedly atrophic. The vaginal cuff is visualized. There are no visible lesions. ThinPrep Pap was submitted without difficulty,. Bimanual examination reveals no masses or nodularity. Rectal confirms.   Assessment/Plan:  A 61 year old with stage IIIC endometrioid adenocarcinoma diagnosed and treated in 2010 who is at her 3-year anniversary, clinically has no evidence of recurrent disease. We did discuss the role of imaging and while initially the patient did not wish to have a CT scan due to concerns about radiation exposure,she wishes to have the reassurance from a negative CT at this time.. We will follow up on results from Pap smear from today. She will  return to see [place] in 6 months and we will notify her of the CT scan results.    Chauncey Sciulli A., MD 07/05/2011, 9:16 AM

## 2011-07-05 NOTE — Patient Instructions (Signed)
RTC 6 months

## 2011-07-12 ENCOUNTER — Ambulatory Visit (HOSPITAL_COMMUNITY)
Admission: RE | Admit: 2011-07-12 | Discharge: 2011-07-12 | Disposition: A | Discharge status: Home / Self Care | Payer: BC Managed Care – PPO | Source: Ambulatory Visit | Attending: Gynecologic Oncology | Admitting: Gynecologic Oncology

## 2011-07-12 DIAGNOSIS — Z9221 Personal history of antineoplastic chemotherapy: Secondary | ICD-10-CM | POA: Insufficient documentation

## 2011-07-12 DIAGNOSIS — C541 Malignant neoplasm of endometrium: Secondary | ICD-10-CM

## 2011-07-12 DIAGNOSIS — Z905 Acquired absence of kidney: Secondary | ICD-10-CM | POA: Insufficient documentation

## 2011-07-12 DIAGNOSIS — Z90710 Acquired absence of both cervix and uterus: Secondary | ICD-10-CM | POA: Insufficient documentation

## 2011-07-12 DIAGNOSIS — C549 Malignant neoplasm of corpus uteri, unspecified: Secondary | ICD-10-CM | POA: Insufficient documentation

## 2011-07-12 DIAGNOSIS — K7689 Other specified diseases of liver: Secondary | ICD-10-CM | POA: Insufficient documentation

## 2011-07-12 DIAGNOSIS — Z923 Personal history of irradiation: Secondary | ICD-10-CM | POA: Insufficient documentation

## 2011-07-12 DIAGNOSIS — K5730 Diverticulosis of large intestine without perforation or abscess without bleeding: Secondary | ICD-10-CM | POA: Insufficient documentation

## 2011-07-12 MED ORDER — IOHEXOL 300 MG/ML  SOLN
100.0000 mL | Freq: Once | INTRAMUSCULAR | Status: AC | PRN
  Administered 2011-07-12: 100 mL via INTRAVENOUS

## 2011-07-13 ENCOUNTER — Telehealth: Payer: Self-pay | Admitting: *Deleted

## 2011-07-13 NOTE — Telephone Encounter (Signed)
Pt notified of CT resluts 

## 2011-07-13 NOTE — Telephone Encounter (Signed)
Patient informed of PAP results  

## 2011-10-07 ENCOUNTER — Ambulatory Visit (INDEPENDENT_AMBULATORY_CARE_PROVIDER_SITE_OTHER): Payer: BC Managed Care – PPO | Admitting: Emergency Medicine

## 2011-10-07 VITALS — BP 124/67 | HR 67 | Temp 98.0°F | Resp 16 | Ht 66.5 in | Wt 207.0 lb

## 2011-10-07 DIAGNOSIS — R51 Headache: Secondary | ICD-10-CM

## 2011-10-07 DIAGNOSIS — J209 Acute bronchitis, unspecified: Secondary | ICD-10-CM

## 2011-10-07 DIAGNOSIS — J018 Other acute sinusitis: Secondary | ICD-10-CM

## 2011-10-07 MED ORDER — PSEUDOEPHEDRINE-GUAIFENESIN ER 60-600 MG PO TB12: 1.0000 | ORAL_TABLET | Freq: Two times a day (BID) | ORAL | Status: AC

## 2011-10-07 MED ORDER — FLUTICASONE PROPIONATE 50 MCG/ACT NA SUSP: 1.0000 | NASAL | Status: AC

## 2011-10-07 MED ORDER — AMOXICILLIN-POT CLAVULANATE 875-125 MG PO TABS: 1.0000 | ORAL_TABLET | Freq: Two times a day (BID) | ORAL | Status: AC

## 2011-10-07 MED ORDER — ALBUTEROL SULFATE HFA 108 (90 BASE) MCG/ACT IN AERS: 2.0000 | INHALATION_SPRAY | RESPIRATORY_TRACT | Status: DC | PRN

## 2011-10-07 NOTE — Progress Notes (Signed)
Date:  10/07/2011   Name:  [name]   DOB:  Jun 14, 1950   MRN:  [id number] Gender: female Age: 61 y.o.  PCP:  Marga Melnick, MD    Chief Complaint: Cough   History of Present Illness:  [name] is a 61 y.o. pleasant patient who presents with the following:  Four week history of nasal congestion and post nasal discharge. Cough and wheezing.  Cough not productive, worse at night.  Clear nasal drainage.  Nonproductive cough.  No fever or chills or shortness of breath.  No nausea or vomiting.  Patient Active Problem List  Diagnosis  . HYPOTHYROIDISM  . ALLERGIC RHINITIS  . GERD  . SLEEP DISORDER  . UTERINE CANCER, HX OF  . Depression    Past Medical History  Diagnosis Date  . History of uterine cancer   . Allergic rhinitis   . GERD (gastroesophageal reflux disease)   . Hypothyroidism     Cytomel Rx'ed by Dr.Vaughan  . Depression   . Cancer 04/2008    uterine    Past Surgical History  Procedure Date  . Total abdominal hysterectomy w/ bilateral salpingoophorectomy 2010  . Abdominal hysterectomy 04/2008    TAH BSO    History  Substance Use Topics  . Smoking status: Former Smoker -- 1.0 packs/day for 25 years    Types: Cigarettes    Quit date: 01/30/1993  . Smokeless tobacco: Not on file  . Alcohol Use: 8.4 oz/week    14 Cans of beer per week    Family History  Problem Relation Age of Onset  . Alcohol abuse Father   . Alcohol abuse Mother   . Arthritis Mother   . Stroke Mother   . Diabetes Mother   . Cancer Paternal Grandfather     stomach    Allergies  Allergen Reactions  . Levofloxacin     REACTION: Rash (urticaria)    Medication list has been reviewed and updated.  Current Outpatient Prescriptions on File Prior to Visit  Medication Sig Dispense Refill  . AMBULATORY NON FORMULARY MEDICATION Multiple Supplements      . AMBULATORY NON FORMULARY MEDICATION Medication Name: reservatrol 2 po qd      .  Cholecalciferol (PA VITAMIN D-3) 5000 UNITS capsule Take 1 capsule (5,000 Units total) by mouth daily.  30 capsule  0  . Coenzyme Q10 (COQ10) 200 MG CAPS Take by mouth. 1 by mouth 4-5 x weekly       . loperamide (IMODIUM) 2 MG capsule Take 2 mg by mouth. 1-4 x daily       . Melatonin 3 MG CAPS 2-3 po qhs    0  . Omega-3 Fatty Acids (FISH OIL PO) Take by mouth. 2 by mouth three times daily       . PROZAC 20 MG capsule TAKE ONE CAPSULE EACH DAY  90 capsule  2  . thyroid (ARMOUR) 60 MG tablet Take 1 tablet (60 mg total) by mouth daily.  90 tablet  3  . fluticasone (FLONASE) 50 MCG/ACT nasal spray Place 1 spray into the nose as directed. 1 spray in each nostril twice a day as needed. Use the "crossover" technique as discussed   16 g  2    Review of Systems:  As per HPI, otherwise negative.    Physical Examination: Filed Vitals:   10/07/11 1104  BP: 124/67  Pulse: 67  Temp: 98 F (36.7 C)  Resp: 16   Filed Vitals:   10/07/11 1104  Height: 5' 6.5" (1.689 m)  Weight: 207 lb (93.895 kg)   Body mass index is 32.91 kg/(m^2). Ideal Body Weight: Weight in (lb) to have BMI = 25: 156.9   GEN: WDWN, NAD, Non-toxic, A & O x 3 HEENT: Atraumatic, Normocephalic. Neck supple. No masses, No LAD. Ears and Nose: No external deformity. CV: RRR, No M/G/R. No JVD. No thrill. No extra heart sounds. PULM: CTA B, no wheezes, crackles, rhonchi. No retractions. No resp. distress. No accessory muscle use. ABD: S, NT, ND, +BS. No rebound. No HSM. EXTR: No c/c/e NEURO Normal gait.  PSYCH: Normally interactive. Conversant. Not depressed or anxious appearing.  Calm demeanor.    Assessment and Plan: Bronchitis with bronchospasm Sinusitis augmentin mucinex d albuterol  Carmelina Dane, MD

## 2011-12-26 ENCOUNTER — Other Ambulatory Visit: Payer: Self-pay | Admitting: Emergency Medicine

## 2012-01-04 ENCOUNTER — Ambulatory Visit: Payer: BC Managed Care – PPO | Attending: Gynecologic Oncology | Admitting: Gynecologic Oncology

## 2012-01-04 ENCOUNTER — Encounter: Payer: Self-pay | Admitting: Gynecologic Oncology

## 2012-01-04 VITALS — BP 122/72 | HR 70 | Temp 98.5°F | Resp 18 | Ht 66.0 in | Wt 208.1 lb

## 2012-01-04 DIAGNOSIS — C549 Malignant neoplasm of corpus uteri, unspecified: Secondary | ICD-10-CM | POA: Insufficient documentation

## 2012-01-04 DIAGNOSIS — C541 Malignant neoplasm of endometrium: Secondary | ICD-10-CM

## 2012-01-04 NOTE — Patient Instructions (Signed)
RTC 6 months

## 2012-01-04 NOTE — Progress Notes (Signed)
Consult Note: Gyn-Onc  [name] 61 y.o. female  CC:  Chief Complaint  Patient presents with  . Endometrial cancer    follow up    HPI: [name] is a very pleasant 61 year old with a stage IIIC endometrial carcinoma that was diagnosed in May 2010. She underwent laparoscopic hysterectomy, BSO and appropriate staging at that time. Pathology revealed a grade 2 endometrioid adenocarcinoma with LVSI. She had bilateral pelvic lymph nodes, 2 out of 2 on the left and 3 out of 3 on the right that were positive. Her pelvic lymph nodes were negative. She completed chemotherapy and radiation in a sandwich type fashion in November 2010. She had post- treatment CT scan in December 2010 and June 2011 that revealed no evidence of recurrent disease. I last saw her in June of 2013 at which time her exam and Pap smear were negative.    CT scan revealed: IMPRESSION:  1. No evidence of local endometrial cancer recurrence or  metastasis within the abdomen or pelvis.  2. Colonic diverticulosis without diverticulitis.  3. Hepatic steatosis.  HEALTH MAINTENANCE: She is up-to-date on her mammograms   FAMILY HISTORY: There are no new medical problems.   Interval History:  She comes in today for followup. She is overall doing quite well and denies any significant complaints. She has had occassional issues with diarrhea so she takes Imodium prn. She had a colonoscopy in April of 2012 that was otherwise negative. About twice a month she will have an occasional sharp fleeting pain in the abdomen.  She continues to have these occasionally she'll also have shooting pains in her legs. They are somewhat random. The diarrhea has overall improved again it is seems to be no significant pattern except that there are certain foods (eggs and sausage) that seem to cause more diarrhea. There is no blood in her stool. She had had a cough since 09/02/2011. Her husband had bronchitis  that they got from an  employee. She did see her primary care physician for this. It is slowly improving but she did notice some shortness of breath with it. She has some stress urinary incontinence with her cough. Her ankle swelling has improved It never wakes her up at night. She started taking Armour Thyroid in May of 2012. She states she is feeling better. Her energy level is improved. She does feel "hotter" but her mood is up and she is very pleased with that. She is otherwise enjoying a good quality of life and continues to stay busy with work. 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   Review of Systems:  She has lost about 2 pounds this time last year. She states that she probably could decrease her portion sizes as well as exercise more.  She occasionally has some swollen ankles but occasionally that seems to be worse when she is up and about quite a bit. 10 point review of systems is otherwise negative. Remainder of her ROS is as above.   Current Meds:  Outpatient Encounter Prescriptions as of 01/04/2012  Medication Sig Dispense Refill  . AMBULATORY NON FORMULARY MEDICATION Multiple Supplements      . AMBULATORY NON FORMULARY MEDICATION Medication Name: reservatrol 2 po qd      . Cholecalciferol (PA VITAMIN D-3) 5000 UNITS capsule Take 1 capsule (5,000 Units total) by mouth daily.  30 capsule  0  . Coenzyme Q10 (COQ10) 200 MG CAPS Take by mouth. 1 by mouth 4-5 x weekly       . loperamide (  IMODIUM) 2 MG capsule Take 2 mg by mouth. 1-4 x daily       . Melatonin 3 MG CAPS 2-3 po qhs    0  . Omega-3 Fatty Acids (FISH OIL PO) Take by mouth. 2 by mouth three times daily       . pseudoephedrine-guaifenesin (MUCINEX D) 60-600 MG per tablet Take 1 tablet by mouth every 12 (twelve) hours.  18 tablet  0  . thyroid (ARMOUR) 60 MG tablet Take 1 tablet (60 mg total) by mouth daily.  90 tablet  3  . VENTOLIN HFA 108 (90 BASE) MCG/ACT inhaler INHALE 2 PUFFS EVERY 4 HOURS AS NEEDED FOR WHEEZING  18 each  1  . fluticasone (FLONASE) 50  MCG/ACT nasal spray Place 1 spray into the nose as directed. 1 spray in each nostril twice a day as needed. Use the "crossover" technique as discussed  16 g  2  . PROZAC 20 MG capsule TAKE ONE CAPSULE EACH DAY  90 capsule  2    Allergy:  Allergies  Allergen Reactions  . Levofloxacin     REACTION: Rash (urticaria)    Social Hx:   History   Social History  . Marital Status: Married    Spouse Name: N/A    Number of Children: N/A  . Years of Education: N/A   Occupational History  . counselor    Social History Main Topics  . Smoking status: Former Smoker -- 1.0 packs/day for 25 years    Types: Cigarettes    Quit date: 01/30/1993  . Smokeless tobacco: Not on file  . Alcohol Use: 8.4 oz/week    14 Cans of beer per week  . Drug Use: No  . Sexually Active: No   Other Topics Concern  . Not on file   Social History Narrative  . No narrative on file    Past Surgical Hx:  Past Surgical History  Procedure Date  . Total abdominal hysterectomy w/ bilateral salpingoophorectomy 2010  . Abdominal hysterectomy 04/2008    TAH BSO    Past Medical Hx:  Past Medical History  Diagnosis Date  . History of uterine cancer   . Allergic rhinitis   . GERD (gastroesophageal reflux disease)   . Hypothyroidism     Cytomel Rx'ed by Dr.Vaughan  . Depression   . Cancer 04/2008    uterine    Family Hx:  Family History  Problem Relation Age of Onset  . Alcohol abuse Father   . Alcohol abuse Mother   . Arthritis Mother   . Stroke Mother   . Diabetes Mother   . Cancer Paternal Grandfather     stomach    Vitals:  Blood pressure 122/72, pulse 70, temperature 98.5 F (36.9 C), temperature source Oral, resp. rate 18, height 5\' 6"  (1.676 m), weight 208 lb 1.6 oz (94.394 kg).  Physical Exam:  GENERAL: Well-nourished, well-developed female, in no acute distress.   NECK: Supple. There is no lymphadenopathy, no thyromegaly.   LUNGS: Clear to auscultation bilaterally.   CARDIOVASCULAR  EXAM: Regular rate and rhythm.   ABDOMEN: Soft, nontender, non-distended with no palpable mass or hepatosplenomegaly. Exam is somewhat limited by habitus. Groins are negative for adenopathy.   EXTREMITIES: There is 1 to 2+ nonpitting edema equal bilaterally.   PELVIC: External genitalia is within normal limits. Vagina is markedly atrophic. The vaginal cuff is visualized. There are no visible lesions. Bimanual examination reveals no masses or nodularity. Rectal confirms.   Assessment/Plan:  A  61 year old with stage IIIC endometrioid adenocarcinoma diagnosed and treated in 2010 who is at her 3-year anniversary, clinically has no evidence of recurrent disease. We did discuss the role of imaging and while initially the patient did not wish to have a CT scan due to concerns about radiation exposure,she wishes to have the reassurance from a negative CT at this time. She will return to see [place] in 6 months.     Kynzley Dowson A., MD 01/04/2012, 10:08 AM

## 2012-01-15 ENCOUNTER — Ambulatory Visit (HOSPITAL_BASED_OUTPATIENT_CLINIC_OR_DEPARTMENT_OTHER): Payer: BC Managed Care – PPO | Admitting: Oncology

## 2012-01-15 ENCOUNTER — Telehealth: Payer: Self-pay | Admitting: Oncology

## 2012-01-15 ENCOUNTER — Encounter: Payer: Self-pay | Admitting: Oncology

## 2012-01-15 ENCOUNTER — Other Ambulatory Visit (HOSPITAL_BASED_OUTPATIENT_CLINIC_OR_DEPARTMENT_OTHER): Payer: BC Managed Care – PPO

## 2012-01-15 VITALS — BP 145/95 | HR 77 | Temp 96.5°F | Resp 20 | Ht 66.0 in | Wt 211.1 lb

## 2012-01-15 DIAGNOSIS — C549 Malignant neoplasm of corpus uteri, unspecified: Secondary | ICD-10-CM

## 2012-01-15 DIAGNOSIS — Z1231 Encounter for screening mammogram for malignant neoplasm of breast: Secondary | ICD-10-CM

## 2012-01-15 DIAGNOSIS — R197 Diarrhea, unspecified: Secondary | ICD-10-CM

## 2012-01-15 DIAGNOSIS — H532 Diplopia: Secondary | ICD-10-CM

## 2012-01-15 DIAGNOSIS — Z8542 Personal history of malignant neoplasm of other parts of uterus: Secondary | ICD-10-CM

## 2012-01-15 LAB — COMPREHENSIVE METABOLIC PANEL (CC13)
Albumin: 3.6 g/dL (ref 3.5–5.0)
BUN: 16 mg/dL (ref 7.0–26.0)
CO2: 25 mEq/L (ref 22–29)
Calcium: 9.2 mg/dL (ref 8.4–10.4)
Chloride: 104 mEq/L (ref 98–107)
Glucose: 113 mg/dl — ABNORMAL HIGH (ref 70–99)
Potassium: 4 mEq/L (ref 3.5–5.1)

## 2012-01-15 LAB — CBC WITH DIFFERENTIAL/PLATELET
Basophils Absolute: 0 10*3/uL (ref 0.0–0.1)
Eosinophils Absolute: 0.2 10*3/uL (ref 0.0–0.5)
HCT: 41.6 % (ref 34.8–46.6)
HGB: 14.5 g/dL (ref 11.6–15.9)
MCV: 95.1 fL (ref 79.5–101.0)
NEUT#: 3.2 10*3/uL (ref 1.5–6.5)
RDW: 12.6 % (ref 11.2–14.5)
lymph#: 1.2 10*3/uL (ref 0.9–3.3)

## 2012-01-15 NOTE — Patient Instructions (Signed)
I will order carotid dopplers and send you to ophthalmology. You need to see Dr Loreen Freud shortly after these are done, for her to follow up results in reference to transient diplopia.

## 2012-01-15 NOTE — Telephone Encounter (Signed)
appts made and printed for pt,pt aware that [facility]en sch will call with u/s appt and when i attempted to make her appt with [facility] she had lot s of restrictions and and sch conflicts.  Printed off phone/address info and the pt will call for her appt and the office is aware       anne

## 2012-01-15 NOTE — Progress Notes (Signed)
OFFICE PROGRESS NOTE   01/15/2012   Physicians:Y.Lowne, P.GEhrig, J.Kinard, P.Hung   INTERVAL HISTORY:  Patient is seen, together with husband, in follow up of endometrial carcinoma, now on observation.  History is of IIIC grade 2 endometrial carcinoma, this treated with laparoscopic TAH/BSO and staging in May 2010 with LVSI and bilateral pelvic node involvement. She received 6 cycles of taxol/carboplatin given in sandwich fashion with RT, all completed Nov 2010. She has been followed since then with observation, without known active disease. Last CT AP was June 2012; last CXR also June 2012.She saw Dr.Gehrig earlier this month with no findings of concern on her exam, and will see her again in 6 months. Patient is not scheduled for routine CT.  Patient has generally been feeling well, with improvement in frequency of bowel movements that has been ongoing since RT.She still uses prn imodium, not daily.  Primary complaint today is of a few minutes of diplopia this AM, which has also occurred sporadically several times in ~ past year, but none x several months until today. She has no associated symptoms with the diplopia, including other TIA symptoms, HA or palpitations, and has not seen anyone for this (last eye exam by optometrist).  Otherwise she had lower respiratory infection in Sept when husband was ill with same symptoms, resolved with herbs. She denies abdominal or pelvic pain, no swelling LE, no bleeding. Remainder of 10 point Review of Systems negative.  Objective:  Vital signs in last 24 hours:  BP 145/95  Pulse 77  Temp 96.5 F (35.8 C) (Oral)  Resp 20  Ht 5\' 6"  (1.676 m)  Wt 211 lb 1.6 oz (95.754 kg)  BMI 34.07 kg/m2 Easily ambulatory, looks comfortable.Weight is up 2.5 l bs from my last visit.    HEENT:PERRLA, sclera clear, anicteric, oropharynx clear, no lesions and neck supple with midline trachea LymphaticsCervical, supraclavicular, and axillary nodes normal. Resp:  clear to auscultation bilaterally and normal percussion bilaterally Cardio: regular rate and rhythm GI: soft, non-tender; bowel sounds normal; no masses,  no organomegaly Extremities: extremities normal, atraumatic, no cyanosis or edema Neuro:speech fluent, fully oriented and appropriate. CN intact. No motor, sensory, cerebellar deficits apparent. Breasts: no dominant mass, skin or nipple findings bilaterally Skin without rash or ecchymosis Lab Results:  Results for orders placed in visit on 07/05/11  CREATININE, SERUM      Component Value Range   Creatinine, Ser 0.62  0.50 - 1.10 mg/dL  BUN      Component Value Range   BUN 15  6 - 23 mg/dL  CBC WITH DIFFERENTIAL      Component Value Range   WBC 5.0  3.9 - 10.3 10e3/uL   NEUT# 3.2  1.5 - 6.5 10e3/uL   HGB 14.5  11.6 - 15.9 g/dL   HCT 04.5  40.9 - 81.1 %   Platelets 239  145 - 400 10e3/uL   MCV 95.1  79.5 - 101.0 fL   MCH 33.1  25.1 - 34.0 pg   MCHC 34.8  31.5 - 36.0 g/dL   RBC 9.14  7.82 - 9.56 10e6/uL   RDW 12.6  11.2 - 14.5 %   lymph# 1.2  0.9 - 3.3 10e3/uL   MONO# 0.4  0.1 - 0.9 10e3/uL   Eosinophils Absolute 0.2  0.0 - 0.5 10e3/uL   Basophils Absolute 0.0  0.0 - 0.1 10e3/uL   NEUT% 64.6  38.4 - 76.8 %   LYMPH% 23.3  14.0 - 49.7 %   MONO%  7.6  0.0 - 14.0 %   EOS% 4.0  0.0 - 7.0 %   BASO% 0.5  0.0 - 2.0 %   CMET available after visit has glucose 113, AST 38 and ALT 75, these normal previously.  Studies/Results: Last mammograms were at [facility] 01-04-11. Ordered again today  Medications: I have reviewed the patient's current medications. She does not want flu vaccine.  Assessment/Plan: 1. IIIC endometrial carcinoma: history as above, clinically doing well. Will be back in touch with her re repeat CMET 2.brief episodes of diplopia: no evaluation to date. I will set up visit to ophthalmology and order carotid dopplers. Patient is instructed to follow up with PCP Dr Laury Axon at least after these are done. 3.hepatic  steatosis, which may be etiology of change in LFTs. Obviously weight loss to ideal would be helpful with medical situation overall. 4.radiation colitis with chronic diarrhea: seems gradually better and controlled with imodium.  Follow up now will be with gyn oncology, tho I am glad to see her back at any time if needed. Patient and husband were comfortable with discussion and plan   Ron Junco P, MD   01/15/2012, 10:04 AM

## 2012-01-19 ENCOUNTER — Ambulatory Visit
Admission: RE | Admit: 2012-01-19 | Discharge: 2012-01-19 | Disposition: A | Discharge status: Home / Self Care | Payer: BC Managed Care – PPO | Source: Ambulatory Visit | Attending: Oncology | Admitting: Oncology

## 2012-01-19 DIAGNOSIS — Z1231 Encounter for screening mammogram for malignant neoplasm of breast: Secondary | ICD-10-CM

## 2012-01-25 ENCOUNTER — Telehealth: Payer: Self-pay

## 2012-01-25 ENCOUNTER — Other Ambulatory Visit: Payer: Self-pay

## 2012-01-25 DIAGNOSIS — Z8542 Personal history of malignant neoplasm of other parts of uterus: Secondary | ICD-10-CM

## 2012-01-25 NOTE — Telephone Encounter (Signed)
Message copied by Lorine Bears on Thu Jan 25, 2012 10:35 AM ------      Message from: Reece Packer      Created: Sun Jan 21, 2012 10:42 AM       Please let her know 2 liver chemistries a little elevated on labs from last week, AST and ALT. This may be related to hepatic steatosis, or fatty liver, but she should repeat CMET here or with Dr Laury Axon in ~ 4 weeks. I have not put in order, so please do that with [facility] lab only if she wants it here. It would be best if she can continue to try to lose weight down to ideal.      She was to call for ophthalmology appointment per Anne's note, so please ask if she got this set up      Please be sure carotid dopplers also set up - I believe central scheduling working on that.            Thanks      Lennis

## 2012-01-25 NOTE — Telephone Encounter (Signed)
Discussed liver chemistries as noted below by Dr. Darrold Span.  Repeat labs will be 02-13-12 at [facility] at 0830.  Carotid US is 02-01-12 at [facility] at 1000.  She is to register at 0945 in [facility] Admittting.  [name] verbalized understanding. She has not call for ophthalmology appt.    She will hopefully do this in the next copuple of weeks.  She is presently out of town with a broken down vehicle.  Requested that she call this office to let us know when ophthalmology  Is scheduled.

## 2012-02-01 ENCOUNTER — Ambulatory Visit (HOSPITAL_COMMUNITY)
Admission: RE | Admit: 2012-02-01 | Discharge: 2012-02-01 | Disposition: A | Discharge status: Home / Self Care | Payer: BC Managed Care – PPO | Source: Ambulatory Visit | Attending: Oncology | Admitting: Oncology

## 2012-02-01 DIAGNOSIS — H5310 Unspecified subjective visual disturbances: Secondary | ICD-10-CM

## 2012-02-01 DIAGNOSIS — H532 Diplopia: Secondary | ICD-10-CM | POA: Insufficient documentation

## 2012-02-01 NOTE — Progress Notes (Signed)
VASCULAR LAB PRELIMINARY  PRELIMINARY  PRELIMINARY  PRELIMINARY  Carotid duplex  completed.    Preliminary report:  Bilateral:  No evidence of hemodynamically significant internal carotid artery stenosis.   Vertebral artery flow is antegrade.      Aneya Daddona, RVT 02/01/2012, 10:39 AM

## 2012-02-05 ENCOUNTER — Telehealth: Payer: Self-pay | Admitting: *Deleted

## 2012-02-05 NOTE — Telephone Encounter (Signed)
Spoke with pt and gave her Dr Precious Reel message below. Pt voiced understanding

## 2012-02-05 NOTE — Telephone Encounter (Signed)
Message copied by Phillis Knack on Mon Feb 05, 2012  2:16 PM ------      Message from: Lorine Bears      Created: Mon Feb 05, 2012 11:42 AM                   ----- Message -----         From: Reece Packer, MD         Sent: 02/02/2012   3:55 PM           To: Lorine Bears, RN            Labs seen and need follow up: please let her know that the carotid dopplers showed some minimal plaque. I will send report to Dr Loreen Freud. Patient needs to follow up with Dr Laury Axon re diplopia.

## 2012-02-13 ENCOUNTER — Other Ambulatory Visit (HOSPITAL_BASED_OUTPATIENT_CLINIC_OR_DEPARTMENT_OTHER): Payer: BC Managed Care – PPO

## 2012-02-13 DIAGNOSIS — Z8542 Personal history of malignant neoplasm of other parts of uterus: Secondary | ICD-10-CM

## 2012-02-13 DIAGNOSIS — C549 Malignant neoplasm of corpus uteri, unspecified: Secondary | ICD-10-CM

## 2012-02-13 LAB — COMPREHENSIVE METABOLIC PANEL (CC13)
ALT: 56 U/L — ABNORMAL HIGH (ref 0–55)
AST: 30 U/L (ref 5–34)
Albumin: 3.5 g/dL (ref 3.5–5.0)
Calcium: 9.4 mg/dL (ref 8.4–10.4)
Chloride: 106 mEq/L (ref 98–107)
Creatinine: 0.7 mg/dL (ref 0.6–1.1)
Potassium: 3.8 mEq/L (ref 3.5–5.1)
Sodium: 140 mEq/L (ref 136–145)

## 2012-02-14 ENCOUNTER — Telehealth: Payer: Self-pay

## 2012-02-14 ENCOUNTER — Telehealth: Payer: Self-pay | Admitting: Internal Medicine

## 2012-02-14 MED ORDER — PROZAC 20 MG PO CAPS: 20.0000 mg | ORAL_CAPSULE | Freq: Every day | ORAL | Status: DC

## 2012-02-14 NOTE — Telephone Encounter (Signed)
Left message on VM for patient, informing patient: Last OV with Dr.Hopper greater than 1 year, medication not on list. Need to discuss

## 2012-02-14 NOTE — Telephone Encounter (Signed)
Refill: Prozac 20 mg cap. Take 1 capsule each day. Qty 90. Last fill 10-13-11

## 2012-02-14 NOTE — Telephone Encounter (Signed)
Told [name] the results of labs as noted below  by Dr. Darrold Span.  [name] that she is trying to loose a little weight and is using some natural interventions. She saw Ophthalogist  Dr. Randon Goldsmith on 02-09-12.  There was nothing to explain the symptoms that she was experiencing.  Shas issues with her left eye over the weekend 1-12;1-13.  She saw Dr. Randon Goldsmith 02-12-12.  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 Randon Goldsmith on 02-19-12.

## 2012-02-14 NOTE — Telephone Encounter (Signed)
Message copied by Lorine Bears on Wed Feb 14, 2012 11:15 AM ------      Message from: Reece Packer      Created: Tue Feb 13, 2012 12:19 PM       Labs seen and need follow up: please let her know liver chemistries are better, one now down to normal and one just barely elevated, so whatever she is doing must be correct (hopefully losing weight!)

## 2012-02-14 NOTE — Telephone Encounter (Signed)
agree

## 2012-02-14 NOTE — Telephone Encounter (Signed)
Prozac is on the medication list. Apt scheduled for Tuesday and the Rx has been sent for Brand name per the patient request.      KP

## 2012-02-14 NOTE — Telephone Encounter (Signed)
I re-reviewed med list and this is NOT on patient's medication list. I have sent message to Administrative staff to have them change PCP from [facility] to [facility]. Message to be forwarded to Dr.Lowne/Assistant for further review

## 2012-02-14 NOTE — Telephone Encounter (Signed)
Pt return call stating that she is not on fluoxetine she is on BMN Prozac and it is on her med list. Pt also notes that she is no longer seeing Dr Alwyn Ren she has changed to Dr Laury Axon. Please see OV 03-22-11

## 2012-02-15 ENCOUNTER — Telehealth: Payer: Self-pay | Admitting: Internal Medicine

## 2012-02-15 NOTE — Telephone Encounter (Signed)
done

## 2012-02-15 NOTE — Telephone Encounter (Signed)
Message copied by Verner Chol on Thu Feb 15, 2012  8:03 AM ------      Message from: Maurice Small      Created: Wed Feb 14, 2012  4:35 PM       Please change PCP to [facility]

## 2012-02-21 ENCOUNTER — Ambulatory Visit (INDEPENDENT_AMBULATORY_CARE_PROVIDER_SITE_OTHER): Payer: BC Managed Care – PPO | Admitting: Family Medicine

## 2012-02-21 ENCOUNTER — Encounter: Payer: Self-pay | Admitting: Family Medicine

## 2012-02-21 VITALS — BP 130/78 | HR 88 | Temp 98.3°F | Wt 209.8 lb

## 2012-02-21 DIAGNOSIS — Z136 Encounter for screening for cardiovascular disorders: Secondary | ICD-10-CM

## 2012-02-21 DIAGNOSIS — F32A Depression, unspecified: Secondary | ICD-10-CM

## 2012-02-21 DIAGNOSIS — F329 Major depressive disorder, single episode, unspecified: Secondary | ICD-10-CM

## 2012-02-21 DIAGNOSIS — F3289 Other specified depressive episodes: Secondary | ICD-10-CM

## 2012-02-21 DIAGNOSIS — E039 Hypothyroidism, unspecified: Secondary | ICD-10-CM

## 2012-02-21 LAB — BASIC METABOLIC PANEL
BUN: 18 mg/dL (ref 6–23)
Calcium: 9.5 mg/dL (ref 8.4–10.5)
Creatinine, Ser: 0.7 mg/dL (ref 0.4–1.2)
GFR: 87.02 mL/min (ref >60.00)
Glucose, Bld: 115 mg/dL — ABNORMAL HIGH (ref 70–99)

## 2012-02-21 LAB — CBC WITH DIFFERENTIAL/PLATELET
Basophils Absolute: 0 10*3/uL (ref 0.0–0.1)
Lymphocytes Relative: 23.6 % (ref 12.0–46.0)
Lymphs Abs: 1.2 10*3/uL (ref 0.7–4.0)
Monocytes Relative: 6.8 % (ref 3.0–12.0)
Neutrophils Relative %: 65.2 % (ref 43.0–77.0)
Platelets: 272 10*3/uL (ref 150.0–400.0)
RDW: 12.8 % (ref 11.5–14.6)

## 2012-02-21 LAB — POCT URINALYSIS DIPSTICK
Blood, UA: NEGATIVE
Glucose, UA: NEGATIVE
Nitrite, UA: NEGATIVE
Urobilinogen, UA: 0.2
pH, UA: 6.5

## 2012-02-21 LAB — LIPID PANEL
Cholesterol: 242 mg/dL — ABNORMAL HIGH (ref 0–200)
Total CHOL/HDL Ratio: 4
VLDL: 34.4 mg/dL (ref 0.0–40.0)

## 2012-02-21 LAB — HEPATIC FUNCTION PANEL
AST: 46 U/L — ABNORMAL HIGH (ref 0–37)
Alkaline Phosphatase: 59 U/L (ref 39–117)
Bilirubin, Direct: 0 mg/dL (ref 0.0–0.3)
Total Bilirubin: 0.9 mg/dL (ref 0.3–1.2)

## 2012-02-21 LAB — LDL CHOLESTEROL, DIRECT: Direct LDL: 148.3 mg/dL

## 2012-02-21 MED ORDER — THYROID 60 MG PO TABS: 60.0000 mg | ORAL_TABLET | Freq: Every day | ORAL | Status: DC

## 2012-02-21 MED ORDER — FLUOXETINE HCL 20 MG PO CAPS: 20.0000 mg | ORAL_CAPSULE | Freq: Every day | ORAL | Status: DC

## 2012-02-21 NOTE — Progress Notes (Signed)
  Subjective:    Patient ID: [name], female    DOB: [birth year], 62 y.o.   MRN: [id number]  HPI Pt here for f/u depression and thyroid.  No new complaints.    Review of Systems    as above Objective:   Physical Exam  BP 130/78  Pulse 88  Temp 98.3 F (36.8 C) (Oral)  Wt 209 lb 12.8 oz (95.165 kg)  SpO2 96% General appearance: alert, cooperative, appears stated age and no distress Nose: Nares normal. Septum midline. Mucosa normal. No drainage or sinus tenderness. Throat: lips, mucosa, and tongue normal; teeth and gums normal Neck: no adenopathy, supple, symmetrical, trachea midline and thyroid not enlarged, symmetric, no tenderness/mass/nodules Lungs: clear to auscultation bilaterally Heart: S1, S2 normal      Assessment & Plan:

## 2012-02-21 NOTE — Assessment & Plan Note (Signed)
Check labs  con't armour thyroid 

## 2012-02-21 NOTE — Patient Instructions (Addendum)

## 2012-02-21 NOTE — Assessment & Plan Note (Signed)
Stable con't meds 

## 2012-03-06 ENCOUNTER — Ambulatory Visit (INDEPENDENT_AMBULATORY_CARE_PROVIDER_SITE_OTHER): Payer: BC Managed Care – PPO | Admitting: Family Medicine

## 2012-03-06 ENCOUNTER — Encounter: Payer: Self-pay | Admitting: Family Medicine

## 2012-03-06 VITALS — BP 126/74 | HR 79 | Temp 98.3°F | Wt 210.6 lb

## 2012-03-06 DIAGNOSIS — E039 Hypothyroidism, unspecified: Secondary | ICD-10-CM

## 2012-03-06 DIAGNOSIS — R7989 Other specified abnormal findings of blood chemistry: Secondary | ICD-10-CM

## 2012-03-06 DIAGNOSIS — R748 Abnormal levels of other serum enzymes: Secondary | ICD-10-CM

## 2012-03-06 LAB — HEPATIC FUNCTION PANEL
ALT: 74 U/L — ABNORMAL HIGH (ref 0–35)
AST: 38 U/L — ABNORMAL HIGH (ref 0–37)
Albumin: 4 g/dL (ref 3.5–5.2)

## 2012-03-06 LAB — GAMMA GT: GGT: 19 U/L (ref 7–51)

## 2012-03-06 NOTE — Assessment & Plan Note (Signed)
Recheck today Fatty liver seen on CT last year

## 2012-03-06 NOTE — Progress Notes (Signed)
  Subjective:    Patient ID: [name], female    DOB: February 25, 1950, 62 y.o.   MRN: [id number]  HPI Pt here for lab to repeat liver enzymes and discuss results.  She is concerned because her dad died from cirrhosis from alcohol and tylenol. No other complaints. Pt admits to drinking a couple of beers a night but stopped when we called her about her labs  Review of Systems As above    Objective:   Physical Exam  BP 126/74  Pulse 79  Temp 98.3 F (36.8 C) (Oral)  Wt 210 lb 9.6 oz (95.528 kg)  SpO2 96% General appearance: alert, cooperative, appears stated age and no distress Neurologic: Grossly normal      Assessment & Plan:

## 2012-03-06 NOTE — Patient Instructions (Addendum)
Liver Profile A liver profile is a battery of tests which helps your caregiver evaluate your liver function. The following tests are often included in the liver profile: Alanine aminotransferase (ALT or SGPT) This is an enzyme found primarily in the liver. Abnormalities may represent liver disease. This is found in cells of the liver so when it is elevated, it has been released by damaged cells. Albumin  The serum albumin is one of the major proteins in the blood and a reflection of the general state of nutrition. This is low when the liver is unable to do its job. It is also low when protein is lost in the urine. NORMAL FINDINGS Adult/Elderly  Total protein: 6.4-8.3 g/dL or 40-98 g/L (SI units)  Albumin: 3.5-5 g/dL or 11-91 g/L (SI units)  Globulin: 2.3-3.4 g/dL  Alpha1 globulin: 4.7-8.2 g/dL or 1-3 g/L (SI units)  Alpha2 globulin: 0.6-1 g/dL or 9-56 g/L (SI units)  Beta globulin: 0.7-1.1 g/dL or 2-13 g/L (SI units) Children  Total protein  Premature infant: 4.2-7.6 g/dL  Newborn: 0.8-6.5 g/dL  Infant: 7-8.4 g/dL  Child: 6.9-6 g/dL  Albumin  Premature infant: 3-4.2 g/dL  Newborn: 2.9-5.2 g/dL  Infant: 8.4-1.3 g/dL  Child: 2-4.4 g/dL Albumin/Globulin ratio - Calculated by dividing the albumin by the globulin. It is a measure of well being.  Alkaline phosphatase  This is an enzyme which is important in diagnosing proper bone and liver functions. NORMAL FINDINGS Age / Normal Value (units/L)  0-5 days / 35-140  Less than 3 yr / 15-60  3-6 yr / 15-50  6-12 yr / 10-50  12-18 yr / 10-40  Adult / 0-35 units/L or 0-0.58 microKat/L (SI Units) (Females tend to have slightly lower levels than males)  Elderly / Slightly higher than adults Aspartate aminotransferase (AST or SGOT) - an enzyme found in skeletal and heart muscle, liver and other organs. Abnormalities may represent liver disease. This is found in cells of the liver so when it is elevated, it has been released  by damaged cells. Bilirubin, Total: A chemical involved with liver functions. High concentrations may result in jaundice. Jaundice is a yellowing of the skin and the whites of the eyes. NORMAL FINDINGS Blood  Adult/elderly/child  Total bilirubin: 0.3-1.0 mg/dL or 0.1-02 micromole/L (SI units)  Indirect bilirubin: 0.2-0.8 mg/dL or 7.2-53.6 micromole/L (SI units)  Direct bilirubin: 0.1-0.3 mg/dL or 6.4-4.0 micromole/L (SI units)  Newborn total bilirubin: 1.0-12.0 mg/dL or 34.7-425 micromole/L (SI units)  Urine0-0.02 mg/dL Ranges for normal findings may vary among different laboratories and hospitals. You should always check with your doctor after having lab work or other tests done to discuss the meaning of your test results and whether your values are considered within normal limits PREPARATION FOR TEST No preparation or fasting is necessary unless you have been informed otherwise. A blood sample is obtained by inserting a needle into a vein in the arm. MEANING OF TEST  Your caregiver will go over the test results with you and discuss the importance and meaning of your results, as well as treatment options and the need for additional tests if necessary. OBTAINING THE TEST RESULTS It is your responsibility to obtain your test results. Ask the lab or department performing the test when and how you will get your results. Document Released: 02/19/2004 Document Revised: 04/10/2011 Document Reviewed: 12/28/2007 [facility] Patient Information 2013 [place], [place].

## 2012-03-07 ENCOUNTER — Telehealth: Payer: Self-pay

## 2012-03-07 NOTE — Telephone Encounter (Signed)
Discussed with patient and she voiced understanding, she is going out of town on Sat and will return on the 21 st. Patient can either it before 10 or after 4 pm tomorrow, if not then she will need it done on the 21 st of Feb when she gets back in town. Order in for [place]      KP

## 2012-03-07 NOTE — Telephone Encounter (Signed)
Message copied by Arnette Norris on Thu Mar 07, 2012  5:40 PM ------      Message from: Lelon Perla      Created: Wed Mar 06, 2012  4:49 PM       LF slightly more elevated----  Check [place] abd

## 2012-03-22 ENCOUNTER — Ambulatory Visit (HOSPITAL_COMMUNITY)
Admission: RE | Admit: 2012-03-22 | Discharge: 2012-03-22 | Disposition: A | Discharge status: Home / Self Care | Payer: BC Managed Care – PPO | Source: Ambulatory Visit | Attending: Family Medicine | Admitting: Family Medicine

## 2012-03-22 DIAGNOSIS — K7689 Other specified diseases of liver: Secondary | ICD-10-CM | POA: Insufficient documentation

## 2012-03-22 DIAGNOSIS — R7989 Other specified abnormal findings of blood chemistry: Secondary | ICD-10-CM | POA: Insufficient documentation

## 2012-03-22 DIAGNOSIS — K8020 Calculus of gallbladder without cholecystitis without obstruction: Secondary | ICD-10-CM | POA: Insufficient documentation

## 2012-06-10 ENCOUNTER — Other Ambulatory Visit: Payer: Self-pay | Admitting: Family Medicine

## 2012-07-11 ENCOUNTER — Ambulatory Visit: Payer: BC Managed Care – PPO | Attending: Gynecologic Oncology | Admitting: Gynecologic Oncology

## 2012-07-11 ENCOUNTER — Encounter: Payer: Self-pay | Admitting: Gynecologic Oncology

## 2012-07-11 ENCOUNTER — Other Ambulatory Visit (HOSPITAL_COMMUNITY)
Admission: RE | Admit: 2012-07-11 | Discharge: 2012-07-11 | Disposition: A | Discharge status: Home / Self Care | Payer: BC Managed Care – PPO | Source: Ambulatory Visit | Attending: Gynecologic Oncology | Admitting: Gynecologic Oncology

## 2012-07-11 VITALS — BP 130/76 | HR 78 | Temp 98.1°F | Resp 16 | Ht 66.0 in | Wt 203.7 lb

## 2012-07-11 DIAGNOSIS — K5730 Diverticulosis of large intestine without perforation or abscess without bleeding: Secondary | ICD-10-CM | POA: Insufficient documentation

## 2012-07-11 DIAGNOSIS — Z79899 Other long term (current) drug therapy: Secondary | ICD-10-CM | POA: Insufficient documentation

## 2012-07-11 DIAGNOSIS — N952 Postmenopausal atrophic vaginitis: Secondary | ICD-10-CM | POA: Insufficient documentation

## 2012-07-11 DIAGNOSIS — E039 Hypothyroidism, unspecified: Secondary | ICD-10-CM | POA: Insufficient documentation

## 2012-07-11 DIAGNOSIS — C549 Malignant neoplasm of corpus uteri, unspecified: Secondary | ICD-10-CM | POA: Insufficient documentation

## 2012-07-11 DIAGNOSIS — K7689 Other specified diseases of liver: Secondary | ICD-10-CM | POA: Insufficient documentation

## 2012-07-11 DIAGNOSIS — Z87891 Personal history of nicotine dependence: Secondary | ICD-10-CM | POA: Insufficient documentation

## 2012-07-11 DIAGNOSIS — Z9221 Personal history of antineoplastic chemotherapy: Secondary | ICD-10-CM | POA: Insufficient documentation

## 2012-07-11 DIAGNOSIS — N393 Stress incontinence (female) (male): Secondary | ICD-10-CM | POA: Insufficient documentation

## 2012-07-11 DIAGNOSIS — Z01419 Encounter for gynecological examination (general) (routine) without abnormal findings: Secondary | ICD-10-CM | POA: Insufficient documentation

## 2012-07-11 DIAGNOSIS — K219 Gastro-esophageal reflux disease without esophagitis: Secondary | ICD-10-CM | POA: Insufficient documentation

## 2012-07-11 DIAGNOSIS — C541 Malignant neoplasm of endometrium: Secondary | ICD-10-CM

## 2012-07-11 DIAGNOSIS — Z90710 Acquired absence of both cervix and uterus: Secondary | ICD-10-CM | POA: Insufficient documentation

## 2012-07-11 DIAGNOSIS — Z923 Personal history of irradiation: Secondary | ICD-10-CM | POA: Insufficient documentation

## 2012-07-11 DIAGNOSIS — Z9079 Acquired absence of other genital organ(s): Secondary | ICD-10-CM | POA: Insufficient documentation

## 2012-07-11 NOTE — Progress Notes (Signed)
Consult Note: Gyn-Onc  [name] 62 y.o. female  CC:  Chief Complaint  Patient presents with  . Endometrial Cancer    Follow up    HPI: [name] is a very pleasant 62 year old with a stage IIIC endometrial carcinoma that was diagnosed in May 2010. She underwent laparoscopic hysterectomy, BSO and appropriate staging at that time. Pathology revealed a grade 2 endometrioid adenocarcinoma with LVSI. She had bilateral pelvic lymph nodes, 2 out of 2 on the left and 3 out of 3 on the right that were positive. Her pelvic lymph nodes were negative. She completed chemotherapy and radiation in a sandwich type fashion in November 2010. She had post- treatment CT scan in December 2010 and June 2011 that revealed no evidence of recurrent disease. I last saw her in December of 2013 at which time her exam and Pap smear were negative.    CT scan revealed: IMPRESSION:  1. No evidence of local endometrial cancer recurrence or  metastasis within the abdomen or pelvis.  2. Colonic diverticulosis without diverticulitis.  3. Hepatic steatosis.  HEALTH MAINTENANCE: She is up-to-date on her mammograms   FAMILY HISTORY: There are no new medical problems.   Interval History:  She comes in today for followup. She is overall doing quite well and denies any significant complaints. She has had occassional issues with diarrhea so she takes Imodium prn. This has been better recently. She had a colonoscopy in April of 2012 that was otherwise negative.  The diarrhea has overall improved again it is seems to be no significant pattern except that there are certain foods (eggs and sausage) that seem to cause more diarrhea. There is no blood in her stool. She had had a cough since 09/02/2011. She has been modifying her diet since of February the lost about 5 pounds. She's given a petite, by mouth, cheese and sugar. She's also been gardening more and horse riding. She's also walking more on the to be  some inactive been busier and eating less. She does wear an occasional mini pad and have slight stress urinary incontinence. Her cough is improved. Mammogram was in December and is up-to-date.  Review of Systems:  She occasionally has some swollen ankles but occasionally that seems to be worse when she is up and about quite a bit. 10 point review of systems is otherwise negative. Remainder of her ROS is as above.   Current Meds:  Outpatient Encounter Prescriptions as of 07/11/2012  Medication Sig Dispense Refill  . AMBULATORY NON FORMULARY MEDICATION Multiple Supplements      . ARMOUR THYROID 60 MG tablet TAKE 1 TABLET ONCE DAILY.  90 tablet  1  . Coenzyme Q10 (COQ10) 200 MG CAPS Take by mouth. 1 by mouth 4-5 x weekly       . FLUoxetine (PROZAC) 20 MG capsule Take 1 capsule (20 mg total) by mouth daily.  90 capsule  3  . fluticasone (FLONASE) 50 MCG/ACT nasal spray Place 1 spray into the nose as directed. 1 spray in each nostril twice a day as needed. Use the "crossover" technique as discussed  16 g  2  . loperamide (IMODIUM) 2 MG capsule Take 2 mg by mouth. 1-4 x daily       . Melatonin 3 MG CAPS 2-3 po qhs    0  . MILK THISTLE PO Take 6 capsules by mouth daily.      . Omega-3 Fatty Acids (FISH OIL PO) Take by mouth. 2 by mouth three times  daily       . pseudoephedrine-guaifenesin (MUCINEX D) 60-600 MG per tablet Take 1 tablet by mouth every 12 (twelve) hours.  18 tablet  0  . VENTOLIN HFA 108 (90 BASE) MCG/ACT inhaler INHALE 2 PUFFS EVERY 4 HOURS AS NEEDED FOR WHEEZING  18 each  1   No facility-administered encounter medications on file as of 07/11/2012.    Allergy:  Allergies  Allergen Reactions  . Levofloxacin     REACTION: Rash (urticaria)    Social Hx:   History   Social History  . Marital Status: Married    Spouse Name: N/A    Number of Children: N/A  . Years of Education: N/A   Occupational History  . counselor    Social History Main Topics  . Smoking status: Former  Smoker -- 1.00 packs/day for 25 years    Types: Cigarettes    Quit date: 01/30/1993  . Smokeless tobacco: Not on file  . Alcohol Use: 8.4 oz/week    14 Cans of beer per week  . Drug Use: No  . Sexually Active: No   Other Topics Concern  . Not on file   Social History Narrative  . No narrative on file    Past Surgical Hx:  Past Surgical History  Procedure Laterality Date  . Total abdominal hysterectomy w/ bilateral salpingoophorectomy  2010  . Abdominal hysterectomy  04/2008    TAH BSO    Past Medical Hx:  Past Medical History  Diagnosis Date  . History of uterine cancer   . Allergic rhinitis   . GERD (gastroesophageal reflux disease)   . Hypothyroidism     Cytomel Rx'ed by Dr.Vaughan  . Depression   . Cancer 04/2008    uterine    Family Hx:  Family History  Problem Relation Age of Onset  . Alcohol abuse Father   . Alcohol abuse Mother   . Arthritis Mother   . Stroke Mother   . Diabetes Mother   . Cancer Paternal Grandfather     stomach    Vitals:  Blood pressure 130/76, pulse 78, temperature 98.1 F (36.7 C), temperature source Oral, resp. rate 16, height 5\' 6"  (1.676 m), weight 203 lb 11.2 oz (92.398 kg).  Physical Exam:  GENERAL: Well-nourished, well-developed female, in no acute distress.   NECK: Supple. There is no lymphadenopathy, no thyromegaly.   LUNGS: Clear to auscultation bilaterally.   CARDIOVASCULAR EXAM: Regular rate and rhythm.   ABDOMEN: Soft, nontender, non-distended with no palpable mass or hepatosplenomegaly. Exam is somewhat limited by habitus. Groins are negative for adenopathy.   EXTREMITIES: There is 1 to 2+ nonpitting edema equal bilaterally.   PELVIC: External genitalia is within normal limits. Vagina is markedly atrophic. The vaginal cuff is visualized. There are no visible lesions. Pap smear submitted without difficulty. Bimanual examination reveals no masses or nodularity. Rectal confirms.   Assessment/Plan:  A 62 year old  with stage IIIC endometrioid adenocarcinoma diagnosed and treated in 2010 who is at her 4-year anniversary, clinically has no evidence of recurrent disease. She will return to see [place] in 6 months.     [name] A., MD 07/11/2012, 9:33 AM

## 2012-07-12 ENCOUNTER — Other Ambulatory Visit: Payer: Self-pay | Admitting: Gynecologic Oncology

## 2012-07-12 DIAGNOSIS — Z8542 Personal history of malignant neoplasm of other parts of uterus: Secondary | ICD-10-CM

## 2012-07-17 ENCOUNTER — Telehealth: Payer: Self-pay | Admitting: *Deleted

## 2012-07-17 NOTE — Telephone Encounter (Signed)
Notified patient of Normal  Pap results.

## 2012-12-05 ENCOUNTER — Other Ambulatory Visit: Payer: Self-pay

## 2012-12-11 ENCOUNTER — Emergency Department (HOSPITAL_COMMUNITY): Payer: BC Managed Care – PPO

## 2012-12-11 ENCOUNTER — Emergency Department (HOSPITAL_COMMUNITY)
Admission: EM | Admit: 2012-12-11 | Discharge: 2012-12-11 | Disposition: A | Discharge status: Home / Self Care | Payer: BC Managed Care – PPO | Attending: Emergency Medicine | Admitting: Emergency Medicine

## 2012-12-11 ENCOUNTER — Encounter (HOSPITAL_COMMUNITY): Payer: Self-pay | Admitting: Emergency Medicine

## 2012-12-11 DIAGNOSIS — R079 Chest pain, unspecified: Secondary | ICD-10-CM | POA: Insufficient documentation

## 2012-12-11 DIAGNOSIS — M7989 Other specified soft tissue disorders: Secondary | ICD-10-CM | POA: Insufficient documentation

## 2012-12-11 DIAGNOSIS — Z87891 Personal history of nicotine dependence: Secondary | ICD-10-CM | POA: Insufficient documentation

## 2012-12-11 DIAGNOSIS — Z79899 Other long term (current) drug therapy: Secondary | ICD-10-CM | POA: Insufficient documentation

## 2012-12-11 DIAGNOSIS — R0602 Shortness of breath: Secondary | ICD-10-CM | POA: Insufficient documentation

## 2012-12-11 DIAGNOSIS — F3289 Other specified depressive episodes: Secondary | ICD-10-CM | POA: Insufficient documentation

## 2012-12-11 DIAGNOSIS — Z8542 Personal history of malignant neoplasm of other parts of uterus: Secondary | ICD-10-CM | POA: Insufficient documentation

## 2012-12-11 DIAGNOSIS — F329 Major depressive disorder, single episode, unspecified: Secondary | ICD-10-CM | POA: Insufficient documentation

## 2012-12-11 DIAGNOSIS — IMO0002 Reserved for concepts with insufficient information to code with codable children: Secondary | ICD-10-CM | POA: Insufficient documentation

## 2012-12-11 DIAGNOSIS — Z8719 Personal history of other diseases of the digestive system: Secondary | ICD-10-CM | POA: Insufficient documentation

## 2012-12-11 DIAGNOSIS — E039 Hypothyroidism, unspecified: Secondary | ICD-10-CM | POA: Insufficient documentation

## 2012-12-11 LAB — POCT I-STAT TROPONIN I
Troponin i, poc: 0 ng/mL (ref 0.00–0.08)
Troponin i, poc: 0 ng/mL (ref 0.00–0.08)

## 2012-12-11 LAB — CBC
Hemoglobin: 15.1 g/dL — ABNORMAL HIGH (ref 12.0–15.0)
MCH: 32.3 pg (ref 26.0–34.0)
RBC: 4.67 MIL/uL (ref 3.87–5.11)
WBC: 6.3 10*3/uL (ref 4.0–10.5)

## 2012-12-11 LAB — BASIC METABOLIC PANEL
CO2: 25 mEq/L (ref 19–32)
Calcium: 9.5 mg/dL (ref 8.4–10.5)
Chloride: 99 mEq/L (ref 96–112)
Glucose, Bld: 104 mg/dL — ABNORMAL HIGH (ref 70–99)
Potassium: 3.8 mEq/L (ref 3.5–5.1)
Sodium: 137 mEq/L (ref 135–145)

## 2012-12-11 MED ORDER — IOHEXOL 350 MG/ML SOLN
100.0000 mL | Freq: Once | INTRAVENOUS | Status: AC | PRN
  Administered 2012-12-11: 100 mL via INTRAVENOUS

## 2012-12-11 NOTE — ED Notes (Signed)
Pt states that she recently had a long car trip. Pt states while she was walking around [employer] she had right leg pain, but states it was likely from walking around so much. Pt states that the left chest pain feels like a muscle pull and taking a deep breath makes the pain worse.

## 2012-12-11 NOTE — ED Notes (Signed)
Pt c/o left sided CP worse with inspiration and some nausea starting this am; pt denies SOB

## 2012-12-11 NOTE — ED Provider Notes (Signed)
CSN: [id number]     Arrival date & time 12/11/12  1123 History   First MD Initiated Contact with Patient 12/11/12 1143     Chief Complaint  Patient presents with  . Chest Pain   (Consider location/radiation/quality/duration/timing/severity/associated sxs/prior Treatment) HPI Comments: Patient is a 62 year old female with history of uterine cancer, hypothyroidism, depression who presents today with sudden onset of left-sided chest pain. The pain is sharp and worse with inspiration. She has not done anything to attempt to make her pain better. She has never had pain like this in the past. She called her primary doctor who recommended she come to the emergency department for further evaluation. She has recently traveled in the car to [place]. She admits to bilateral lower leg swelling. She states this is chronic and an issue that she has been dealing with ever since her hysterectomy. She denies prior history of DVT or PE, hemoptysis, exogenous estrogen. She denies recent illness, fever, chills, cough.  Patient is a 62 y.o. female presenting with chest pain. The history is provided by the patient. No language interpreter was used.  Chest Pain Associated symptoms: shortness of breath   Associated symptoms: no abdominal pain, no cough, no fever, no nausea and not vomiting     Past Medical History  Diagnosis Date  . History of uterine cancer   . Allergic rhinitis   . GERD (gastroesophageal reflux disease)   . Hypothyroidism     Cytomel Rx'ed by Dr.Vaughan  . Depression   . Cancer 04/2008    uterine   Past Surgical History  Procedure Laterality Date  . Total abdominal hysterectomy w/ bilateral salpingoophorectomy  2010  . Abdominal hysterectomy  04/2008    TAH BSO   Family History  Problem Relation Age of Onset  . Alcohol abuse Father   . Alcohol abuse Mother   . Arthritis Mother   . Stroke Mother   . Diabetes Mother   . Cancer Paternal Grandfather     stomach   History   Substance Use Topics  . Smoking status: Former Smoker -- 1.00 packs/day for 25 years    Types: Cigarettes    Quit date: 01/30/1993  . Smokeless tobacco: Not on file  . Alcohol Use: 8.4 oz/week    14 Cans of beer per week   OB History   Grav Para Term Preterm Abortions TAB SAB Ect Mult Living                 Review of Systems  Constitutional: Negative for fever and chills.  Respiratory: Positive for shortness of breath. Negative for cough, wheezing and stridor.   Cardiovascular: Positive for chest pain and leg swelling (bilateral - chronic).  Gastrointestinal: Negative for nausea, vomiting and abdominal pain.  Skin: Negative for rash.  All other systems reviewed and are negative.    Allergies  Levofloxacin  Home Medications   Current Outpatient Rx  Name  Route  Sig  Dispense  Refill  . AMBULATORY NON FORMULARY MEDICATION      Multiple Supplements         . ARMOUR THYROID 60 MG tablet      TAKE 1 TABLET ONCE DAILY.   90 tablet   1   . Coenzyme Q10 (COQ10) 200 MG CAPS   Oral   Take by mouth. 1 by mouth 4-5 x weekly          . FLUoxetine (PROZAC) 20 MG capsule   Oral   Take 1 capsule (  20 mg total) by mouth daily.   90 capsule   3   . EXPIRED: fluticasone (FLONASE) 50 MCG/ACT nasal spray   Nasal   Place 1 spray into the nose as directed. 1 spray in each nostril twice a day as needed. Use the "crossover" technique as discussed   16 g   2   . loperamide (IMODIUM) 2 MG capsule   Oral   Take 2 mg by mouth. 1-4 x daily          . Melatonin 3 MG CAPS      2-3 po qhs      0   . MILK THISTLE PO   Oral   Take 6 capsules by mouth daily.         . Omega-3 Fatty Acids (FISH OIL PO)   Oral   Take by mouth. 2 by mouth three times daily          . VENTOLIN HFA 108 (90 BASE) MCG/ACT inhaler      INHALE 2 PUFFS EVERY 4 HOURS AS NEEDED FOR WHEEZING   18 each   1    BP 164/87  Pulse 79  Temp(Src) 98.7 F (37.1 C) (Oral)  Resp 18  SpO2  96% Physical Exam  Nursing note and vitals reviewed. Constitutional: She is oriented to person, place, and time. She appears well-developed and well-nourished.  Non-toxic appearance. She does not have a sickly appearance. She does not appear ill. No distress.  HENT:  Head: Normocephalic and atraumatic.  Right Ear: External ear normal.  Left Ear: External ear normal.  Nose: Nose normal.  Mouth/Throat: Oropharynx is clear and moist.  Eyes: Conjunctivae are normal.  Neck: Normal range of motion.  Cardiovascular: Normal rate, regular rhythm, normal heart sounds, intact distal pulses and normal pulses.   Pulses:      Dorsalis pedis pulses are 2+ on the right side, and 2+ on the left side.       Posterior tibial pulses are 2+ on the right side, and 2+ on the left side.  1+ nonpitting edema bilaterally. Both right and left leg are of equal size. No erythema.  Pulmonary/Chest: Effort normal and breath sounds normal. No stridor. No respiratory distress. She has no wheezes. She has no rales. She exhibits no tenderness and no bony tenderness.  Abdominal: Soft. She exhibits no distension.  Musculoskeletal: Normal range of motion.  Neurological: She is alert and oriented to person, place, and time. She has normal strength. Coordination normal.  Grip strength 5 out of 5 bilaterally  Skin: Skin is warm and dry. She is not diaphoretic. No erythema.  Psychiatric: She has a normal mood and affect. Her behavior is normal.    ED Course  Procedures (including critical care time) Labs Review Labs Reviewed  CBC - Abnormal; Notable for the following:    Hemoglobin 15.1 (*)    All other components within normal limits  BASIC METABOLIC PANEL - Abnormal; Notable for the following:    Glucose, Bld 104 (*)    All other components within normal limits  POCT I-STAT TROPONIN I  POCT I-STAT TROPONIN I   Imaging Review Dg Chest 2 View  12/11/2012   CLINICAL DATA:  Chest pain.  EXAM: CHEST  2 VIEW   COMPARISON:  July 18, 2010.  FINDINGS: The heart size and mediastinal contours are within normal limits. Both lungs are clear. The visualized skeletal structures are unremarkable.  IMPRESSION: No active cardiopulmonary disease.   Electronically Signed  By: Roque Lias M.D.   On: 12/11/2012 12:31   Ct Angio Chest Pe W/cm &/or Wo Cm  12/11/2012   CLINICAL DATA:  Chest pain.  EXAM: CT ANGIOGRAPHY CHEST WITH CONTRAST  TECHNIQUE: Multidetector CT imaging of the chest was performed using the standard protocol during bolus administration of intravenous contrast. Multiplanar CT image reconstructions including MIPs were obtained to evaluate the vascular anatomy.  CONTRAST:  OMNIPAQUE IOHEXOL 350 MG/ML SOLN  COMPARISON:  Chest x-ray dated 12/11/2012 and chest CT dated 01/25/2009  FINDINGS: There are no pulmonary emboli, infiltrates, or effusions. No hilar or mediastinal adenopathy or mass lesions. Heart size is normal. There is a 4 mm nodule adjacent to the major fissure in the right lower lobe which is more apparent on the current study but appears unchanged since 08/21/2008. No followup of this is recommended. Lungs are otherwise clear.  No acute osseous abnormality.  Images of the upper abdomen demonstrate slight hepatomegaly. Calcified granulomas in the otherwise normal-appearing spleen.  Review of the MIP images confirms the above findings.  IMPRESSION: No pulmonary emboli or other significant abnormality of the chest. Slight nonspecific hepatomegaly.   Electronically Signed   By: Geanie Cooley M.D.   On: 12/11/2012 14:12    EKG Interpretation     Ventricular Rate:  77 PR Interval:  156 QRS Duration: 102 QT Interval:  406 QTC Calculation: 459 R Axis:   -17 Text Interpretation:  Normal sinus rhythm Normal ECG            MDM   1. Chest pain    Patient is a 62 year old female who presents to the emergency department for left-sided pleuritic chest pain. High-Risk due to history of cancer  and recent travel periods of immobilization. CT angiogram of the chest was performed. Imaging was unremarkable. Specifically no pulmonary emboli. Troponin was negative x2. Lab work is unremarkable. Patient was discharged home with instructions to follow-up with her primary care physician. Strict return instructions given. Vital signs stable for discharge. Discussed case with Dr. Judd Lien who agrees with plan. Patient / Family / Caregiver informed of clinical course, understand medical decision-making process, and agree with plan.     Mora Bellman, PA-C 12/11/12 1735

## 2012-12-12 NOTE — ED Provider Notes (Signed)
Medical screening examination/treatment/procedure(s) were performed by non-physician practitioner and as supervising physician I was immediately available for consultation/collaboration.  EKG Interpretation     Ventricular Rate:  77 PR Interval:  156 QRS Duration: 102 QT Interval:  406 QTC Calculation: 459 R Axis:   -17 Text Interpretation:  Normal sinus rhythm Normal ECG             Geoffery Lyons, MD 12/12/12 [phone]

## 2013-01-07 ENCOUNTER — Other Ambulatory Visit (HOSPITAL_COMMUNITY)
Admission: RE | Admit: 2013-01-07 | Discharge: 2013-01-07 | Disposition: A | Discharge status: Home / Self Care | Payer: BC Managed Care – PPO | Source: Ambulatory Visit | Attending: Gynecologic Oncology | Admitting: Gynecologic Oncology

## 2013-01-07 ENCOUNTER — Encounter: Payer: Self-pay | Admitting: Gynecologic Oncology

## 2013-01-07 ENCOUNTER — Ambulatory Visit: Payer: BC Managed Care – PPO | Attending: Gynecologic Oncology | Admitting: Gynecologic Oncology

## 2013-01-07 VITALS — BP 156/85 | HR 75 | Temp 98.0°F | Resp 16 | Ht 66.0 in | Wt 203.2 lb

## 2013-01-07 DIAGNOSIS — K219 Gastro-esophageal reflux disease without esophagitis: Secondary | ICD-10-CM | POA: Insufficient documentation

## 2013-01-07 DIAGNOSIS — K7689 Other specified diseases of liver: Secondary | ICD-10-CM | POA: Insufficient documentation

## 2013-01-07 DIAGNOSIS — E039 Hypothyroidism, unspecified: Secondary | ICD-10-CM | POA: Insufficient documentation

## 2013-01-07 DIAGNOSIS — K5730 Diverticulosis of large intestine without perforation or abscess without bleeding: Secondary | ICD-10-CM | POA: Insufficient documentation

## 2013-01-07 DIAGNOSIS — J45909 Unspecified asthma, uncomplicated: Secondary | ICD-10-CM | POA: Insufficient documentation

## 2013-01-07 DIAGNOSIS — Z90710 Acquired absence of both cervix and uterus: Secondary | ICD-10-CM | POA: Insufficient documentation

## 2013-01-07 DIAGNOSIS — Z87891 Personal history of nicotine dependence: Secondary | ICD-10-CM | POA: Insufficient documentation

## 2013-01-07 DIAGNOSIS — Z8542 Personal history of malignant neoplasm of other parts of uterus: Secondary | ICD-10-CM

## 2013-01-07 DIAGNOSIS — Z09 Encounter for follow-up examination after completed treatment for conditions other than malignant neoplasm: Secondary | ICD-10-CM | POA: Insufficient documentation

## 2013-01-07 DIAGNOSIS — R609 Edema, unspecified: Secondary | ICD-10-CM | POA: Insufficient documentation

## 2013-01-07 DIAGNOSIS — C541 Malignant neoplasm of endometrium: Secondary | ICD-10-CM

## 2013-01-07 DIAGNOSIS — Z9079 Acquired absence of other genital organ(s): Secondary | ICD-10-CM | POA: Insufficient documentation

## 2013-01-07 DIAGNOSIS — F329 Major depressive disorder, single episode, unspecified: Secondary | ICD-10-CM | POA: Insufficient documentation

## 2013-01-07 DIAGNOSIS — F3289 Other specified depressive episodes: Secondary | ICD-10-CM | POA: Insufficient documentation

## 2013-01-07 DIAGNOSIS — N952 Postmenopausal atrophic vaginitis: Secondary | ICD-10-CM | POA: Insufficient documentation

## 2013-01-07 DIAGNOSIS — Z9221 Personal history of antineoplastic chemotherapy: Secondary | ICD-10-CM | POA: Insufficient documentation

## 2013-01-07 DIAGNOSIS — Z01419 Encounter for gynecological examination (general) (routine) without abnormal findings: Secondary | ICD-10-CM | POA: Insufficient documentation

## 2013-01-07 DIAGNOSIS — Z79899 Other long term (current) drug therapy: Secondary | ICD-10-CM | POA: Insufficient documentation

## 2013-01-07 NOTE — Progress Notes (Signed)
Consult Note: Gyn-Onc  [name] 62 y.o. female  CC:  Chief Complaint  Patient presents with  . 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 She underwent laparoscopic hysterectomy, BSO and appropriate staging at that time. Pathology revealed a grade 2 endometrioid adenocarcinoma with LVSI. She had bilateral pelvic lymph nodes, 2 out of 2 on the left and 3 out of 3 on the right that were positive. Her pelvic lymph nodes were negative. She completed chemotherapy and radiation in a sandwich type fashion in November 2010. She had post- treatment CT scan in December 2010 and June 2011 that revealed no evidence of recurrent disease. I  saw her in December of 2013 at which time her exam and Pap smear were negative.   12/13 CT scan revealed: IMPRESSION:  1. No evidence of local endometrial cancer recurrence or  metastasis within the abdomen or pelvis.  2. Colonic diverticulosis without diverticulitis.  3. Hepatic steatosis.  HEALTH MAINTENANCE: She is up-to-date on her mammograms   FAMILY HISTORY: There are no new medical problems.   Interval History:  I last saw her in June 2014 at which time her Pap smear was negative. She was seen emergency room in November of this year for chest pain and had negative troponins and  a negative CT angiogram. She was told by her primary physician that she had a fatty liver. She had a liver ultrasound performed that was unremarkable. She's cut back on appears that S.-" therapy cheese. She's lost about 5 pounds. She's not really exercising. She does have a cold currently does have some issues with asthma. She needs to use her inhaler about 6 times over the past 6 months. She's not really exercising. She had a mammogram performed last year.  Review of Systems:  She occasionally has some swollen ankles but occasionally that seems to be worse when she is  up and about quite a bit. 10 point review of systems is otherwise negative. Remainder of her ROS is as above.   Current Meds:  Outpatient Encounter Prescriptions as of 01/07/2013  Medication Sig  . albuterol (PROVENTIL HFA;VENTOLIN HFA) 108 (90 BASE) MCG/ACT inhaler Inhale 2 puffs into the lungs every 4 (four) hours as needed for wheezing or shortness of breath.  Marland Kitchen aspirin 81 MG tablet Take 81 mg by mouth daily.  Marland Kitchen b complex vitamins tablet Take 1 tablet by mouth daily.  . Cholecalciferol (VITAMIN D-3 PO) Take 1 tablet by mouth daily.  . Coenzyme Q10 (COQ10) 200 MG CAPS Take by mouth. 1 by mouth 4-5 x weekly   . FLUoxetine (PROZAC) 20 MG capsule Take 1 capsule (20 mg total) by mouth daily.  Marland Kitchen GAMMA AMINOBUTYRIC ACID PO Take 500 mg by mouth daily.  Marland Kitchen loperamide (IMODIUM) 2 MG capsule Take 2 mg by mouth. 1-4 x daily   . Melatonin 3 MG CAPS 2-3 po qhs  . MILK THISTLE PO Take 6 capsules by mouth daily.  Marland Kitchen thyroid (ARMOUR) 60 MG tablet Take 60 mg by mouth daily before breakfast.  . TURMERIC PO Take 2 tablets by mouth daily.  . fluticasone (FLONASE) 50 MCG/ACT nasal spray Place 1 spray into the nose as directed. 1 spray in each nostril twice a day as needed. Use the "crossover" technique as discussed  . Omega-3 Fatty Acids (FISH OIL PO) Take 2 capsules by mouth 2 (two) times daily. 2 by mouth three  times daily  . OVER THE COUNTER MEDICATION Take 2 capsules by mouth daily. Over the counter "Zyflamend" herbal joint health  . RESVERATROL PO Take 2 tablets by mouth daily.    Allergy:  Allergies  Allergen Reactions  . Levofloxacin     REACTION: Rash (urticaria)    Social Hx:   History   Social History  . Marital Status: Married    Spouse Name: N/A    Number of Children: N/A  . Years of Education: N/A   Occupational History  . counselor    Social History Main Topics  . Smoking status: Former Smoker -- 1.00 packs/day for 25 years    Types: Cigarettes    Quit date: 01/30/1993  .  Smokeless tobacco: Not on file  . Alcohol Use: 8.4 oz/week    14 Cans of beer per week  . Drug Use: No  . Sexual Activity: No   Other Topics Concern  . Not on file   Social History Narrative  . No narrative on file    Past Surgical Hx:  Past Surgical History  Procedure Laterality Date  . Total abdominal hysterectomy w/ bilateral salpingoophorectomy  2010  . Abdominal hysterectomy  04/2008    TAH BSO    Past Medical Hx:  Past Medical History  Diagnosis Date  . History of uterine cancer   . Allergic rhinitis   . GERD (gastroesophageal reflux disease)   . Hypothyroidism     Cytomel Rx'ed by Dr.Vaughan  . Depression   . Cancer 04/2008    uterine    Family Hx:  Family History  Problem Relation Age of Onset  . Alcohol abuse Father   . Alcohol abuse Mother   . Arthritis Mother   . Stroke Mother   . Diabetes Mother   . Cancer Paternal Grandfather     stomach    Vitals:  Blood pressure 156/85, pulse 75, temperature 98 F (36.7 C), resp. rate 16, height 5\' 6"  (1.676 m), weight 203 lb 3.2 oz (92.171 kg).  Physical Exam:  GENERAL: Well-nourished, well-developed female, in no acute distress.   NECK: Supple. There is no lymphadenopathy, no thyromegaly.   LUNGS: Clear to auscultation bilaterally.   CARDIOVASCULAR EXAM: Regular rate and rhythm.   ABDOMEN: Soft, nontender, non-distended with no palpable mass or hepatosplenomegaly. Exam is somewhat limited by habitus. Groins are negative for adenopathy.   EXTREMITIES: There is 1 to 2+ nonpitting edema equal bilaterally.   PELVIC: External genitalia is within normal limits. Vagina is markedly atrophic. The vaginal cuff is visualized. There are no visible lesions. Pap smear submitted without difficulty. Bimanual examination reveals no masses or nodularity. Rectal confirms.   Assessment/Plan:  A 62 year old with stage IIIC endometrioid adenocarcinoma diagnosed and treated in 2010 who is at her 4-year anniversary, clinically  has no evidence of recurrent disease. She will return to see [place] in 6 months.     Bookert Guzzi A., MD 01/07/2013, 4:12 PM

## 2013-01-07 NOTE — Patient Instructions (Signed)
We will notify you of the results of your Pap smear. Please return to see me in 6 months.

## 2013-01-08 ENCOUNTER — Ambulatory Visit: Payer: BC Managed Care – PPO | Admitting: Gynecologic Oncology

## 2013-01-10 ENCOUNTER — Telehealth: Payer: Self-pay | Admitting: Gynecologic Oncology

## 2013-01-10 NOTE — Telephone Encounter (Signed)
Pt notified about pap results: negative.  No questions or concerns voiced. 

## 2013-03-03 ENCOUNTER — Other Ambulatory Visit: Payer: Self-pay | Admitting: Family Medicine

## 2013-03-21 ENCOUNTER — Ambulatory Visit (INDEPENDENT_AMBULATORY_CARE_PROVIDER_SITE_OTHER): Payer: BC Managed Care – PPO | Admitting: Family Medicine

## 2013-03-21 ENCOUNTER — Encounter: Payer: Self-pay | Admitting: Family Medicine

## 2013-03-21 VITALS — BP 124/82 | HR 81 | Temp 97.9°F | Wt 203.0 lb

## 2013-03-21 DIAGNOSIS — Z8542 Personal history of malignant neoplasm of other parts of uterus: Secondary | ICD-10-CM

## 2013-03-21 DIAGNOSIS — E669 Obesity, unspecified: Secondary | ICD-10-CM | POA: Insufficient documentation

## 2013-03-21 DIAGNOSIS — E039 Hypothyroidism, unspecified: Secondary | ICD-10-CM

## 2013-03-21 LAB — LIPID PANEL
CHOL/HDL RATIO: 4
CHOLESTEROL: 207 mg/dL — AB (ref 0–200)
HDL: 51.8 mg/dL (ref >39.00)
Triglycerides: 93 mg/dL (ref 0.0–149.0)
VLDL: 18.6 mg/dL (ref 0.0–40.0)

## 2013-03-21 LAB — CBC WITH DIFFERENTIAL/PLATELET
Basophils Absolute: 0 10*3/uL (ref 0.0–0.1)
Basophils Relative: 0.4 % (ref 0.0–3.0)
EOS PCT: 3 % (ref 0.0–5.0)
Eosinophils Absolute: 0.2 10*3/uL (ref 0.0–0.7)
HCT: 43.8 % (ref 36.0–46.0)
Hemoglobin: 14.9 g/dL (ref 12.0–15.0)
Lymphocytes Relative: 19.5 % (ref 12.0–46.0)
Lymphs Abs: 1.1 10*3/uL (ref 0.7–4.0)
MCHC: 34 g/dL (ref 30.0–36.0)
MCV: 93.9 fl (ref 78.0–100.0)
MONO ABS: 0.3 10*3/uL (ref 0.1–1.0)
MONOS PCT: 5.6 % (ref 3.0–12.0)
NEUTROS PCT: 71.5 % (ref 43.0–77.0)
Neutro Abs: 4.2 10*3/uL (ref 1.4–7.7)
Platelets: 283 10*3/uL (ref 150.0–400.0)
RBC: 4.66 Mil/uL (ref 3.87–5.11)
RDW: 12.7 % (ref 11.5–14.6)
WBC: 5.8 10*3/uL (ref 4.5–10.5)

## 2013-03-21 LAB — HEMOGLOBIN A1C: Hgb A1c MFr Bld: 5.4 % (ref 4.6–6.5)

## 2013-03-21 LAB — HEPATIC FUNCTION PANEL
ALK PHOS: 52 U/L (ref 39–117)
ALT: 21 U/L (ref 0–35)
AST: 19 U/L (ref 0–37)
Albumin: 4.1 g/dL (ref 3.5–5.2)
BILIRUBIN TOTAL: 0.5 mg/dL (ref 0.3–1.2)
Bilirubin, Direct: 0 mg/dL (ref 0.0–0.3)
Total Protein: 7.2 g/dL (ref 6.0–8.3)

## 2013-03-21 LAB — LDL CHOLESTEROL, DIRECT: Direct LDL: 141.5 mg/dL

## 2013-03-21 LAB — POCT URINALYSIS DIPSTICK
Bilirubin, UA: NEGATIVE
Glucose, UA: NEGATIVE
Ketones, UA: NEGATIVE
Leukocytes, UA: NEGATIVE
NITRITE UA: NEGATIVE
Protein, UA: NEGATIVE
RBC UA: NEGATIVE
Spec Grav, UA: >=1.03
UROBILINOGEN UA: 0.2
pH, UA: 6

## 2013-03-21 LAB — T3, FREE: T3, Free: 2.7 pg/mL (ref 2.3–4.2)

## 2013-03-21 LAB — TSH: TSH: 0.74 u[IU]/mL (ref 0.35–5.50)

## 2013-03-21 NOTE — Patient Instructions (Signed)

## 2013-03-21 NOTE — Progress Notes (Signed)
Pre visit review using our clinic review tool, if applicable. No additional management support is needed unless otherwise documented below in the visit note. 

## 2013-03-21 NOTE — Progress Notes (Signed)
Patient ID: [name], female   DOB: Mar 19, 1950, 63 y.o.   MRN: [id number]   Subjective:    Patient ID: [name], female    DOB: [birth year], 63 y.o.   MRN: [id number] HPI Pt here f/u thyroid.  Pt having no problems.           Objective:    BP 124/82  Pulse 81  Temp(Src) 97.9 F (36.6 C) (Oral)  Wt 203 lb (92.08 kg)  SpO2 97% General appearance: alert, cooperative, appears stated age and no distress Throat: lips, mucosa, and tongue normal; teeth and gums normal Neck: no adenopathy, supple, symmetrical, trachea midline and thyroid not enlarged, symmetric, no tenderness/mass/nodules Lungs: clear to auscultation bilaterally Heart: S1, S2 normal        Assessment & Plan:  1. History of uterine cancer Check  Labs per pt request Pt having no problems - Vitamin D 1,25 dihydroxy - CBC with Differential - Hepatic function panel - Hemoglobin A1c - Lipid panel - POCT urinalysis dipstick  2. Unspecified hypothyroidism Check labs - TSH - T3, free - T4, free - Hemoglobin A1c - Lipid panel - POCT urinalysis dipstick

## 2013-03-24 ENCOUNTER — Encounter: Payer: Self-pay | Admitting: Family Medicine

## 2013-03-24 ENCOUNTER — Other Ambulatory Visit: Payer: Self-pay | Admitting: Family Medicine

## 2013-03-24 DIAGNOSIS — E785 Hyperlipidemia, unspecified: Secondary | ICD-10-CM

## 2013-03-24 LAB — T4, FREE: FREE T4: 0.69 ng/dL (ref 0.60–1.60)

## 2013-03-25 LAB — VITAMIN D 1,25 DIHYDROXY
Vitamin D 1, 25 (OH)2 Total: 66 pg/mL (ref 18–72)
Vitamin D3 1, 25 (OH)2: 66 pg/mL

## 2013-03-26 ENCOUNTER — Encounter: Payer: Self-pay | Admitting: Family Medicine

## 2013-03-26 MED ORDER — VITAMIN D (ERGOCALCIFEROL) 1.25 MG (50000 UNIT) PO CAPS: 50000.0000 [IU] | ORAL_CAPSULE | ORAL | Status: DC

## 2013-04-09 ENCOUNTER — Encounter: Payer: Self-pay | Admitting: Family Medicine

## 2013-04-09 ENCOUNTER — Other Ambulatory Visit: Payer: Self-pay | Admitting: Family Medicine

## 2013-04-23 ENCOUNTER — Other Ambulatory Visit: Payer: Self-pay | Admitting: Family Medicine

## 2013-05-27 ENCOUNTER — Other Ambulatory Visit: Payer: Self-pay | Admitting: Family Medicine

## 2013-06-16 ENCOUNTER — Other Ambulatory Visit: Payer: Self-pay | Admitting: Family Medicine

## 2013-07-17 ENCOUNTER — Other Ambulatory Visit (HOSPITAL_COMMUNITY)
Admission: RE | Admit: 2013-07-17 | Discharge: 2013-07-17 | Disposition: A | Discharge status: Home / Self Care | Payer: BC Managed Care – PPO | Source: Ambulatory Visit | Attending: Gynecologic Oncology | Admitting: Gynecologic Oncology

## 2013-07-17 ENCOUNTER — Encounter: Payer: Self-pay | Admitting: Gynecologic Oncology

## 2013-07-17 ENCOUNTER — Ambulatory Visit: Payer: BC Managed Care – PPO | Attending: Gynecologic Oncology | Admitting: Gynecologic Oncology

## 2013-07-17 VITALS — BP 149/82 | HR 81 | Temp 98.2°F | Resp 16 | Ht 66.0 in | Wt 209.2 lb

## 2013-07-17 DIAGNOSIS — Z01419 Encounter for gynecological examination (general) (routine) without abnormal findings: Secondary | ICD-10-CM | POA: Insufficient documentation

## 2013-07-17 DIAGNOSIS — K219 Gastro-esophageal reflux disease without esophagitis: Secondary | ICD-10-CM | POA: Insufficient documentation

## 2013-07-17 DIAGNOSIS — N952 Postmenopausal atrophic vaginitis: Secondary | ICD-10-CM

## 2013-07-17 DIAGNOSIS — L293 Anogenital pruritus, unspecified: Secondary | ICD-10-CM

## 2013-07-17 DIAGNOSIS — Z8541 Personal history of malignant neoplasm of cervix uteri: Secondary | ICD-10-CM | POA: Insufficient documentation

## 2013-07-17 DIAGNOSIS — C549 Malignant neoplasm of corpus uteri, unspecified: Secondary | ICD-10-CM | POA: Insufficient documentation

## 2013-07-17 DIAGNOSIS — C541 Malignant neoplasm of endometrium: Secondary | ICD-10-CM

## 2013-07-17 DIAGNOSIS — Z90710 Acquired absence of both cervix and uterus: Secondary | ICD-10-CM | POA: Insufficient documentation

## 2013-07-17 DIAGNOSIS — Z9221 Personal history of antineoplastic chemotherapy: Secondary | ICD-10-CM

## 2013-07-17 DIAGNOSIS — Z923 Personal history of irradiation: Secondary | ICD-10-CM

## 2013-07-17 DIAGNOSIS — E039 Hypothyroidism, unspecified: Secondary | ICD-10-CM | POA: Insufficient documentation

## 2013-07-17 DIAGNOSIS — Z8542 Personal history of malignant neoplasm of other parts of uterus: Secondary | ICD-10-CM

## 2013-07-17 MED ORDER — NYSTATIN-TRIAMCINOLONE 100000-0.1 UNIT/GM-% EX OINT: 1.0000 "application " | TOPICAL_OINTMENT | Freq: Two times a day (BID) | CUTANEOUS | Status: DC

## 2013-07-17 NOTE — Patient Instructions (Signed)
Will notify you of the results of your Pap smear.   Nystatin; Triamcinolone cream or ointment What is this medicine? NYSTATIN; TRIAMCINOLONE (nye STAT in; trye am SIN oh lone) is a combination of an antifungal medicine and a steroid. It is used to treat certain kinds of fungal or yeast infections of the skin. This medicine may be used for other purposes; ask your health care provider or pharmacist if you have questions. COMMON BRAND NAME(S): Myco-Triacet-II, Mycogen-II, Mycolog II, Mytrex, N.T.A. What should I tell my health care provider before I take this medicine? They need to know if you have any of these conditions: -large areas of burned or damaged skin -skin wasting or thinning -peripheral vascular disease or poor circulation -an unusual or allergic reaction to nystatin, triamcinolone, other corticosteroids, other medicines, foods, dyes, or preservatives -pregnant or trying to get pregnant -breast-feeding How should I use this medicine? This medicine is for external use only. Do not take by mouth. Follow the directions on the prescription label. Wash your hands before and after use. If treating hand or nail infections, wash hands before use only. Apply a thin layer of this medicine to the affected area and rub in gently. Do not use on healthy skin or over large areas of skin. Do not get this medicine in your eyes. If you do, rinse out with plenty of cool tap water. When applying to the groin area, apply a limited amount and do not use for longer than 2 weeks unless directed to by your doctor or health care professional. Do not cover or wrap the treated area with an airtight bandage (such as a plastic bandage). Use the full course of treatment prescribed, even if you think the infection is getting better. Use at regular intervals. Do not use your medicine more often than directed. Do not use this medicine for any condition other than the one for which it was prescribed. Talk to your  pediatrician regarding the use of this medicine in children. While this drug may be prescribed for selected conditions, precautions do apply. Children being treated in the diaper area should not wear tight-fitting diapers or plastic pants. Elderly patients are more likely to have damaged skin through aging, and this may increase side effects. This medicine should only be used for brief periods and infrequently in older patients. Overdosage: If you think you have taken too much of this medicine contact a poison control center or emergency room at once. NOTE: This medicine is only for you. Do not share this medicine with others. What if I miss a dose? If you miss a dose, use it as soon as you can. If it is almost time for your next dose, use only that dose. Do not use double or extra doses. What may interact with this medicine? Interactions are not expected. Do not use any other skin products on the affected area without telling your doctor or health care professional. This list may not describe all possible interactions. Give your health care provider a list of all the medicines, herbs, non-prescription drugs, or dietary supplements you use. Also tell them if you smoke, drink alcohol, or use illegal drugs. Some items may interact with your medicine. What should I watch for while using this medicine? Tell your doctor or health care professional if your symptoms do not start to get better within 1 week when treating the groin area or within 2 weeks when treating the feet. . Tell your doctor or health care professional if you  develop sores or blisters that do not heal properly. If your skin infection returns after stopping this medicine, contact your doctor or health care professional. If you are using this medicine to treat an infection in the groin area, do not wear underwear that is tight-fitting or made from synthetic fibers such as rayon or nylon. Instead, wear loose-fitting, cotton underwear. Also dry  the area completely after bathing. What side effects may I notice from receiving this medicine? Side effects that you should report to your doctor or health care professional as soon as possible: -burning or itching of the skin -dark red spots on the skin -loss of feeling on skin -painful, red, pus-filled blisters in hair follicles -skin infection -thinning of the skin or sunburn: more likely if applied to the face Side effects that usually do not require medical attention (report to your doctor or health care professional if they continue or are bothersome): -dry or peeling skin -skin irritation This list may not describe all possible side effects. Call your doctor for medical advice about side effects. You may report side effects to FDA at 1-800-FDA-1088. Where should I keep my medicine? Keep out of the reach of children. Store at room temperature between 15 and 30 degrees C (59 and 86 degrees F). Do not freeze. Throw away any unused medicine after the expiration date. NOTE: This sheet is a summary. It may not cover all possible information. If you have questions about this medicine, talk to your doctor, pharmacist, or health care provider.  2015, [employer]. (2007-08-09 17:29:26)

## 2013-07-17 NOTE — Progress Notes (Signed)
Consult Note: Gyn-Onc  [name] 63 y.o. female  CC:  Chief Complaint  Patient presents with  . Endometrial Cancer    Follow up    HPI: 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 She underwent laparoscopic hysterectomy, BSO and appropriate staging at that time. Pathology revealed a grade 2 endometrioid adenocarcinoma with LVSI. She had bilateral pelvic lymph nodes, 2 out of 2 on the left and 3 out of 3 on the right that were positive. Her pelvic lymph nodes were negative. She completed chemotherapy and radiation in a sandwich type fashion in November 2010. She had post- treatment CT scan in December 2010 and June 2011 that revealed no evidence of recurrent disease. I  saw her in December of 2014 at which time her exam and Pap smear were negative.   12/13 CT scan revealed: IMPRESSION:  1. No evidence of local endometrial cancer recurrence or metastasis within the abdomen or pelvis.  2. Colonic diverticulosis without diverticulitis.  3. Hepatic steatosis.  HEALTH MAINTENANCE: She is up-to-date on her mammograms   FAMILY HISTORY: There are no new medical problems.   Interval History:  I last saw her in Dec 2014 at which time her Pap smear was negative. She was seen emergency room in November of 2014 for chest pain and had negative troponins and  a negative CT angiogram. She was told by her primary physician that she had a fatty liver. She had a liver ultrasound performed that was unremarkable. She's been doing fairly well. For the past 6 months she's had some itching and burning in the vagina. Intermittently worse than at other times. At times her husband states that she is "close to 2 years". She usually wears a pad secondary to some urinary leakage. She is trying not to wear one at night and notices that this might help. She's been using aloe and another over-the-counter lubricant and this also  similarly helped. She has no dysuria. There's been no discharge. The itching and burning is both external as well as in the vagina. There is no change in her bowel or bladder habits. She did have an episode of some left upper quadrant pain. She takes menopausal muscle as the pain is now gone. She does have some shortness of breath due to her asthma which is worse in the heat. She has gained approximately 6 pounds since we last saw her. She's not been his mind for her diet. She'll he walks but 1800 2400 steps per day.  Review of Systems:  She occasionally has some swollen ankles but occasionally that seems to be worse when she is up and about quite a bit. 10 point review of systems is otherwise negative. Remainder of her ROS is as above.   Current Meds:  Outpatient Encounter Prescriptions as of 07/17/2013  Medication Sig  . ARMOUR THYROID 60 MG tablet TAKE 1 TABLET ONCE DAILY.  Marland Kitchen aspirin 81 MG tablet Take 81 mg by mouth daily.  Marland Kitchen b complex vitamins tablet Take 1 tablet by mouth daily.  . Cholecalciferol (VITAMIN D-3 PO) Take 1 tablet by mouth daily.  . Coenzyme Q10 (COQ10) 200 MG CAPS Take by mouth. 1 by mouth 4-5 x weekly   . FLUoxetine (PROZAC) 20 MG capsule TAKE ONE CAPSULE BY MOUTH EVERY DAY  . fluticasone (FLONASE) 50 MCG/ACT nasal spray Place 1 spray into the nose as directed. 1 spray in each nostril twice a day as  needed. Use the "crossover" technique as discussed  . GAMMA AMINOBUTYRIC ACID PO Take 500 mg by mouth daily.  Marland Kitchen loperamide (IMODIUM) 2 MG capsule Take 2 mg by mouth. 1-4 x daily   . Melatonin 3 MG CAPS 2-3 po qhs  . MILK THISTLE PO Take 6 capsules by mouth daily.  . Omega-3 Fatty Acids (FISH OIL PO) Take 2 capsules by mouth 2 (two) times daily. 2 by mouth three times daily  . OVER THE COUNTER MEDICATION Take 2 capsules by mouth daily. Over the counter "Zyflamend" herbal joint health  . RESVERATROL PO Take 2 tablets by mouth daily.  . TURMERIC PO Take 2 tablets by mouth daily.   . VENTOLIN HFA 108 (90 BASE) MCG/ACT inhaler INHALE 2 PUFFS EVERY 4 HOURS AS NEEDED FOR WHEEZING  . Vitamin D, Ergocalciferol, (DRISDOL) 50000 UNITS CAPS capsule Take 1 capsule (50,000 Units total) by mouth every 7 (seven) days.    Allergy:  Allergies  Allergen Reactions  . Levofloxacin     REACTION: Rash (urticaria)    Social Hx:   History   Social History  . Marital Status: Married    Spouse Name: N/A    Number of Children: N/A  . Years of Education: N/A   Occupational History  . counselor    Social History Main Topics  . Smoking status: Former Smoker -- 1.00 packs/day for 25 years    Types: Cigarettes    Quit date: 01/30/1993  . Smokeless tobacco: Not on file  . Alcohol Use: 8.4 oz/week    14 Cans of beer per week  . Drug Use: No  . Sexual Activity: No   Other Topics Concern  . Not on file   Social History Narrative  . No narrative on file    Past Surgical Hx:  Past Surgical History  Procedure Laterality Date  . Total abdominal hysterectomy w/ bilateral salpingoophorectomy  2010  . Abdominal hysterectomy  04/2008    TAH BSO    Past Medical Hx:  Past Medical History  Diagnosis Date  . History of uterine cancer   . Allergic rhinitis   . GERD (gastroesophageal reflux disease)   . Hypothyroidism     Cytomel Rx'ed by Dr.Vaughan  . Depression   . Cancer 04/2008    uterine    Family Hx:  Family History  Problem Relation Age of Onset  . Alcohol abuse Father   . Alcohol abuse Mother   . Arthritis Mother   . Stroke Mother   . Diabetes Mother   . Cancer Paternal Grandfather     stomach    Vitals:  Blood pressure 149/82, pulse 81, temperature 98.2 F (36.8 C), temperature source Oral, resp. rate 16, height 5\' 6"  (1.676 m), weight 209 lb 3.2 oz (94.892 kg).  Physical Exam:  GENERAL: Well-nourished, well-developed female, in no acute distress.   NECK: Supple. There is no lymphadenopathy, no thyromegaly.   LUNGS: Clear to auscultation bilaterally.    CARDIOVASCULAR EXAM: Regular rate and rhythm.   ABDOMEN: Soft, nontender, non-distended with no palpable mass or hepatosplenomegaly. Exam is somewhat limited by habitus. Groins are negative for adenopathy.   EXTREMITIES: There is 1 to 2+ nonpitting edema equal bilaterally.   PELVIC: External genitalia is within normal limits with mild erythema L>R, no lesions. Vagina is markedly atrophic. The vaginal cuff is visualized. There are no visible lesions. Pap smear submitted without difficulty. Bimanual examination reveals no masses or nodularity. Rectal confirms.   Assessment/Plan:  A 63 year old  with stage IIIC endometrioid adenocarcinoma diagnosed and treated in 2010 who is at her 5-year anniversary, clinically has no evidence of recurrent disease. We'll notify her of the results of her Pap smear. She will try the nystatin triamcinolone cream for symptomatic relief. Her exam was unremarkable today with the exception of some atrophic changes. She does use that until the symptoms resolve and then to go back to using her other nonsteroid containing lubricants. She wishes to followup with [place]. Return to see [place] in 1 year. She understands to call us if she has any questions. She does not wish to do a mammogram. She will do thermagraphy.     GEHRIG,PAOLA A., MD 07/17/2013, 9:10 AM

## 2013-07-22 LAB — CYTOLOGY - PAP

## 2013-11-14 ENCOUNTER — Telehealth: Payer: Self-pay | Admitting: Family Medicine

## 2013-11-14 NOTE — Telephone Encounter (Signed)
Caller name: [place]  Call back Ethel: [id number] - [place], [place]   Reason for call:  Pt is in between insurance, does not start medicare until April, and can not afford a huge MD bill for a visit for med follow up.  She just doesn't have the money for the visit, can barely afford the medications.   Needing a refill on Rx's: Vitamin D, Ergocalciferol, (DRISDOL) 50000 UNITS CAPS capsule  FLUoxetine (PROZAC) 20 MG capsule

## 2013-11-16 NOTE — Telephone Encounter (Signed)
Pt does not need refill for Vit D.  Can take OTC supplement daily.  Can have Prozac #30, no refills to be sent to either local pharmacy or [facility] (it is on the $4 list).  PCP will determine if pt can have additional refills when she returns

## 2013-11-17 NOTE — Telephone Encounter (Signed)
MSG left to call the office      KP 

## 2013-11-19 MED ORDER — FLUOXETINE HCL 20 MG PO CAPS
ORAL_CAPSULE | ORAL | Status: DC
Start: 2013-11-19 — End: 2014-05-04

## 2013-11-19 NOTE — Telephone Encounter (Signed)
Spoke with patient and she stated she has been out of town ans this is why she has not returned my call. She has agreed to take the OTC vitamin D and said she will follow up with Dr.Lowne as soon as she gets her new insurance card.      KP

## 2013-11-19 NOTE — Addendum Note (Signed)
Addended by: Ewing Schlein on: 11/19/2013 09:32 AM   Modules accepted: Orders

## 2014-05-04 ENCOUNTER — Ambulatory Visit (INDEPENDENT_AMBULATORY_CARE_PROVIDER_SITE_OTHER): Payer: Medicare Other | Admitting: Family Medicine

## 2014-05-04 ENCOUNTER — Encounter: Payer: Self-pay | Admitting: Family Medicine

## 2014-05-04 VITALS — BP 122/76 | HR 84 | Temp 97.4°F | Wt 210.8 lb

## 2014-05-04 DIAGNOSIS — L719 Rosacea, unspecified: Secondary | ICD-10-CM | POA: Diagnosis not present

## 2014-05-04 DIAGNOSIS — E039 Hypothyroidism, unspecified: Secondary | ICD-10-CM | POA: Diagnosis not present

## 2014-05-04 DIAGNOSIS — F329 Major depressive disorder, single episode, unspecified: Secondary | ICD-10-CM | POA: Diagnosis not present

## 2014-05-04 DIAGNOSIS — F32A Depression, unspecified: Secondary | ICD-10-CM

## 2014-05-04 DIAGNOSIS — C541 Malignant neoplasm of endometrium: Secondary | ICD-10-CM

## 2014-05-04 LAB — CBC WITH DIFFERENTIAL/PLATELET
Basophils Absolute: 0 10*3/uL (ref 0.0–0.1)
Basophils Relative: 0.5 % (ref 0.0–3.0)
EOS PCT: 3.2 % (ref 0.0–5.0)
Eosinophils Absolute: 0.2 10*3/uL (ref 0.0–0.7)
HCT: 42.8 % (ref 36.0–46.0)
Hemoglobin: 14.8 g/dL (ref 12.0–15.0)
LYMPHS PCT: 21 % (ref 12.0–46.0)
Lymphs Abs: 1.2 10*3/uL (ref 0.7–4.0)
MCHC: 34.6 g/dL (ref 30.0–36.0)
MCV: 91.8 fl (ref 78.0–100.0)
MONO ABS: 0.4 10*3/uL (ref 0.1–1.0)
Monocytes Relative: 7 % (ref 3.0–12.0)
NEUTROS PCT: 68.3 % (ref 43.0–77.0)
Neutro Abs: 3.8 10*3/uL (ref 1.4–7.7)
Platelets: 288 10*3/uL (ref 150.0–400.0)
RBC: 4.66 Mil/uL (ref 3.87–5.11)
RDW: 13.1 % (ref 11.5–15.5)
WBC: 5.6 10*3/uL (ref 4.0–10.5)

## 2014-05-04 LAB — HEPATIC FUNCTION PANEL
ALT: 26 U/L (ref 0–35)
AST: 23 U/L (ref 0–37)
Albumin: 3.9 g/dL (ref 3.5–5.2)
Alkaline Phosphatase: 52 U/L (ref 39–117)
BILIRUBIN TOTAL: 0.6 mg/dL (ref 0.2–1.2)
Bilirubin, Direct: 0.1 mg/dL (ref 0.0–0.3)
Total Protein: 7.2 g/dL (ref 6.0–8.3)

## 2014-05-04 LAB — BASIC METABOLIC PANEL
BUN: 16 mg/dL (ref 6–23)
CALCIUM: 9.5 mg/dL (ref 8.4–10.5)
CO2: 28 mEq/L (ref 19–32)
CREATININE: 0.62 mg/dL (ref 0.40–1.20)
Chloride: 105 mEq/L (ref 96–112)
GFR: 102.69 mL/min (ref >60.00)
GLUCOSE: 106 mg/dL — AB (ref 70–99)
Potassium: 4.3 mEq/L (ref 3.5–5.1)
Sodium: 139 mEq/L (ref 135–145)

## 2014-05-04 LAB — LIPID PANEL
CHOLESTEROL: 235 mg/dL — AB (ref 0–200)
HDL: 61.7 mg/dL (ref >39.00)
LDL CALC: 142 mg/dL — AB (ref 0–99)
NonHDL: 173.3
TRIGLYCERIDES: 159 mg/dL — AB (ref 0.0–149.0)
Total CHOL/HDL Ratio: 4
VLDL: 31.8 mg/dL (ref 0.0–40.0)

## 2014-05-04 LAB — POCT URINALYSIS DIPSTICK
Bilirubin, UA: NEGATIVE
Blood, UA: NEGATIVE
Glucose, UA: NEGATIVE
Ketones, UA: NEGATIVE
Leukocytes, UA: NEGATIVE
Nitrite, UA: NEGATIVE
PH UA: 6
Protein, UA: NEGATIVE
Spec Grav, UA: 1.025
UROBILINOGEN UA: NEGATIVE

## 2014-05-04 LAB — TSH: TSH: 1.26 u[IU]/mL (ref 0.35–4.50)

## 2014-05-04 MED ORDER — METRONIDAZOLE 0.75 % EX GEL: 1.0000 "application " | Freq: Two times a day (BID) | CUTANEOUS | Status: DC

## 2014-05-04 MED ORDER — NYSTATIN-TRIAMCINOLONE 100000-0.1 UNIT/GM-% EX OINT: 1.0000 "application " | TOPICAL_OINTMENT | Freq: Two times a day (BID) | CUTANEOUS | Status: DC

## 2014-05-04 MED ORDER — THYROID 60 MG PO TABS: 60.0000 mg | ORAL_TABLET | Freq: Every day | ORAL | Status: DC

## 2014-05-04 MED ORDER — FLUOXETINE HCL 20 MG PO CAPS: ORAL_CAPSULE | ORAL | Status: DC

## 2014-05-04 NOTE — Assessment & Plan Note (Signed)
Refill metrogel

## 2014-05-04 NOTE — Patient Instructions (Addendum)
Hypothyroidism The thyroid is a large gland located in the lower front of your neck. The thyroid gland helps control metabolism. Metabolism is how your body handles food. It controls metabolism with the hormone thyroxine. When this gland is underactive (hypothyroid), it produces too little hormone.  CAUSES These include:   Absence or destruction of thyroid tissue.  Goiter due to iodine deficiency.  Goiter due to medications.  Congenital defects (since birth).  Problems with the pituitary. This causes a lack of TSH (thyroid stimulating hormone). This hormone tells the thyroid to turn out more hormone. SYMPTOMS  Lethargy (feeling as though you have no energy)  Cold intolerance  Weight gain (in spite of normal food intake)  Dry skin  Coarse hair  Menstrual irregularity (if severe, may lead to infertility)  Slowing of thought processes Cardiac problems are also caused by insufficient amounts of thyroid hormone. Hypothyroidism in the newborn is cretinism, and is an extreme form. It is important that this form be treated adequately and immediately or it will lead rapidly to retarded physical and mental development. DIAGNOSIS  To prove hypothyroidism, your caregiver may do blood tests and ultrasound tests. Sometimes the signs are hidden. It may be necessary for your caregiver to watch this illness with blood tests either before or after diagnosis and treatment. TREATMENT  Low levels of thyroid hormone are increased by using synthetic thyroid hormone. This is a safe, effective treatment. It usually takes about four weeks to gain the full effects of the medication. After you have the full effect of the medication, it will generally take another four weeks for problems to leave. Your caregiver may start you on low doses. If you have had heart problems the dose may be gradually increased. It is generally not an emergency to get rapidly to normal. HOME CARE INSTRUCTIONS   Take your  medications as your caregiver suggests. Let your caregiver know of any medications you are taking or start taking. Your caregiver will help you with dosage schedules.  As your condition improves, your dosage needs may increase. It will be necessary to have continuing blood tests as suggested by your caregiver.  Report all suspected medication side effects to your caregiver. SEEK MEDICAL CARE IF: Seek medical care if you develop:  Sweating.  Tremulousness (tremors).  Anxiety.  Rapid weight loss.  Heat intolerance.  Emotional swings.  Diarrhea.  Weakness. SEEK IMMEDIATE MEDICAL CARE IF:  You develop chest pain, an irregular heart beat (palpitations), or a rapid heart beat. MAKE SURE YOU:   Understand these instructions.  Will watch your condition.  Will get help right away if you are not doing well or get worse. Document Released: 01/16/2005 Document Revised: 04/10/2011 Document Reviewed: 09/06/2007 ExitCare Patient Information 2015 [employer]. This information is not intended to replace advice given to you by your health care provider. Make sure you discuss any questions you have with your health care provider.  

## 2014-05-04 NOTE — Assessment & Plan Note (Signed)
Refill armour thyroid Check labs

## 2014-05-04 NOTE — Assessment & Plan Note (Signed)
Per gyn 

## 2014-05-04 NOTE — Progress Notes (Signed)
Pre visit review using our clinic review tool, if applicable. No additional management support is needed unless otherwise documented below in the visit note. 

## 2014-05-04 NOTE — Progress Notes (Signed)
Patient ID: [name], female    DOB: Nov 18, 1950  Age: 64 y.o. MRN: [id number]    Subjective:  Subjective HPI [name] presents for f/u thyroid, depression and refills.  No complaints.    Review of Systems  Constitutional: Negative for activity change, appetite change and unexpected weight change.  Respiratory: Negative for cough and shortness of breath.   Cardiovascular: Negative for chest pain and palpitations.  Psychiatric/Behavioral: Negative for behavioral problems and dysphoric mood. The patient is not nervous/anxious.     History Past Medical History  Diagnosis Date  . History of uterine cancer   . Allergic rhinitis   . GERD (gastroesophageal reflux disease)   . Hypothyroidism     Cytomel Rx'ed by Dr.Vaughan  . Depression   . Cancer 04/2008    uterine    She has past surgical history that includes Total abdominal hysterectomy w/ bilateral salpingoophorectomy (2010) and Abdominal hysterectomy (04/2008).   Her family history includes Alcohol abuse in her father and mother; Arthritis in her mother; Cancer in her paternal grandfather; Diabetes in her mother; Stroke in her mother.She reports that she quit smoking about 21 years ago. Her smoking use included Cigarettes. She has a 25 pack-year smoking history. She does not have any smokeless tobacco history on file. She reports that she drinks about 8.4 oz of alcohol per week. She reports that she does not use illicit drugs.  Current Outpatient Prescriptions on File Prior to Visit  Medication Sig Dispense Refill  . aspirin 81 MG tablet Take 81 mg by mouth daily.    Marland Kitchen b complex vitamins tablet Take 1 tablet by mouth daily.    . Cholecalciferol (VITAMIN D-3 PO) Take 1 tablet by mouth daily.    . Coenzyme Q10 (COQ10) 200 MG CAPS Take by mouth. 1 by mouth 4-5 x weekly     . fluticasone (FLONASE) 50 MCG/ACT nasal spray Place 1 spray into the nose as directed. 1 spray in each nostril twice a day as  needed. Use the "crossover" technique as discussed 16 g 2  . GAMMA AMINOBUTYRIC ACID PO Take 500 mg by mouth daily.    Marland Kitchen loperamide (IMODIUM) 2 MG capsule Take 2 mg by mouth. 1-4 x daily     . Melatonin 3 MG CAPS 2-3 po qhs  0  . Omega-3 Fatty Acids (FISH OIL PO) Take 2 capsules by mouth 2 (two) times daily. 2 by mouth three times daily    . OVER THE COUNTER MEDICATION Take 2 capsules by mouth daily. Over the counter "Zyflamend" herbal joint health    . RESVERATROL PO Take 2 tablets by mouth daily.    . TURMERIC PO Take 2 tablets by mouth 2 (two) times daily.     . VENTOLIN HFA 108 (90 BASE) MCG/ACT inhaler INHALE 2 PUFFS EVERY 4 HOURS AS NEEDED FOR WHEEZING 18 each 1  . MILK THISTLE PO Take 6 capsules by mouth daily.     No current facility-administered medications on file prior to visit.     Objective:  Objective Physical Exam  Constitutional: She is oriented to person, place, and time. She appears well-developed and well-nourished. No distress.  HENT:  Right Ear: External ear normal.  Left Ear: External ear normal.  Nose: Nose normal.  Mouth/Throat: Oropharynx is clear and moist.  Eyes: EOM are normal. Pupils are equal, round, and reactive to light.  Neck: Normal range of motion. Neck supple.  Cardiovascular: Normal rate, regular rhythm and normal heart sounds.  No murmur heard. Pulmonary/Chest: Effort normal and breath sounds normal. No respiratory distress. She has no wheezes. She has no rales. She exhibits no tenderness.  Neurological: She is alert and oriented to person, place, and time.  Psychiatric: She has a normal mood and affect. Her behavior is normal. Judgment and thought content normal.   BP 122/76 mmHg  Pulse 84  Temp(Src) 97.4 F (36.3 C) (Oral)  Wt 210 lb 12.8 oz (95.618 kg)  SpO2 96% Wt Readings from Last 3 Encounters:  05/04/14 210 lb 12.8 oz (95.618 kg)  07/17/13 209 lb 3.2 oz (94.892 kg)  03/21/13 203 lb (92.08 kg)     Lab Results  Component Value  Date   WBC 5.6 05/04/2014   HGB 14.8 05/04/2014   HCT 42.8 05/04/2014   PLT 288.0 05/04/2014   GLUCOSE 106* 05/04/2014   CHOL 235* 05/04/2014   TRIG 159.0* 05/04/2014   HDL 61.70 05/04/2014   LDLDIRECT 141.5 03/21/2013   LDLCALC 142* 05/04/2014   ALT 26 05/04/2014   AST 23 05/04/2014   NA 139 05/04/2014   K 4.3 05/04/2014   CL 105 05/04/2014   CREATININE 0.62 05/04/2014   BUN 16 05/04/2014   CO2 28 05/04/2014   TSH 1.26 05/04/2014   HGBA1C 5.4 03/21/2013    No results found.   Assessment & Plan:  Plan I have discontinued [name]'s Vitamin D (Ergocalciferol). I have also changed her ARMOUR THYROID to thyroid. Additionally, I am having her maintain her Omega-3 Fatty Acids (FISH OIL PO), CoQ10, loperamide, Melatonin, fluticasone, MILK THISTLE PO, TURMERIC PO, Cholecalciferol (VITAMIN D-3 PO), RESVERATROL PO, b complex vitamins, GAMMA AMINOBUTYRIC ACID PO, OVER THE COUNTER MEDICATION, aspirin, VENTOLIN HFA, FLUoxetine, nystatin-triamcinolone ointment, and metroNIDAZOLE.  Meds ordered this encounter  Medications  . DISCONTD: metroNIDAZOLE (METROGEL) 0.75 % gel    Sig: Apply 1 application topically 2 (two) times daily.  Marland Kitchen FLUoxetine (PROZAC) 20 MG capsule    Sig: TAKE ONE CAPSULE BY MOUTH EVERY DAY    Dispense:  90 capsule    Refill:  3    D/C PREVIOUS SCRIPTS FOR THIS MEDICATION  . nystatin-triamcinolone ointment (MYCOLOG)    Sig: Apply 1 application topically 2 (two) times daily.    Dispense:  30 g    Refill:  12  . metroNIDAZOLE (METROGEL) 0.75 % gel    Sig: Apply 1 application topically 2 (two) times daily.    Dispense:  45 g    Refill:  5  . thyroid (ARMOUR THYROID) 60 MG tablet    Sig: Take 1 tablet (60 mg total) by mouth daily.    Dispense:  90 tablet    Refill:  3    Problem List Items Addressed This Visit    Depression    Stable-- refill prozac      Relevant Medications   FLUoxetine (PROZAC) capsule   Hypothyroidism    Refill armour  thyroid Check labs      Relevant Medications   thyroid (ARMOUR) tablet   Other Relevant Orders   Basic metabolic panel (Completed)   CBC with Differential/Platelet (Completed)   Hepatic function panel (Completed)   Lipid panel (Completed)   POCT urinalysis dipstick (Completed)   TSH (Completed)   Rosacea - Primary    Refill metrogel      Relevant Medications   metroNIDAZOLE (METROGEL) 0.75 % gel    Other Visit Diagnoses    Endometrial cancer        Relevant Medications  nystatin-triamcinolone ointment (MYCOLOG)       Follow-up: Return if symptoms worsen or fail to improve, for welcome to medicare.  Garnet Koyanagi, DO

## 2014-05-04 NOTE — Assessment & Plan Note (Signed)
Stable-- refill prozac

## 2014-05-08 ENCOUNTER — Encounter: Payer: Self-pay | Admitting: Family Medicine

## 2014-07-20 ENCOUNTER — Encounter: Payer: Self-pay | Admitting: Gynecologic Oncology

## 2014-07-20 ENCOUNTER — Ambulatory Visit: Payer: Medicare Other | Attending: Gynecologic Oncology | Admitting: Gynecologic Oncology

## 2014-07-20 VITALS — BP 151/65 | HR 75 | Temp 98.2°F | Resp 16 | Ht 66.0 in | Wt 214.6 lb

## 2014-07-20 DIAGNOSIS — Z8542 Personal history of malignant neoplasm of other parts of uterus: Secondary | ICD-10-CM

## 2014-07-20 DIAGNOSIS — C541 Malignant neoplasm of endometrium: Secondary | ICD-10-CM | POA: Diagnosis not present

## 2014-07-20 NOTE — Patient Instructions (Signed)
Plan to follow up in one year or sooner if needed.  Please call closer to the date to schedule your appointment.  Please call for any questions or concerns.

## 2014-07-20 NOTE — Progress Notes (Signed)
Consult Note: Gyn-Onc  [name] 64 y.o. female  CC:  Chief Complaint  Patient presents with  . 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 She underwent laparoscopic hysterectomy, BSO and appropriate staging at that time. Pathology revealed a grade 2 endometrioid adenocarcinoma with LVSI. She had bilateral pelvic lymph nodes, 2 out of 2 on the left and 3 out of 3 on the right that were positive. Her pelvic lymph nodes were negative. She completed chemotherapy and radiation in a sandwich type fashion in November 2010. She had post- treatment CT scan in December 2010 and June 2011 that revealed no evidence of recurrent disease. She was last seen in June of 2015 at which time her exam and Pap smear were negative.   12/13 CT scan revealed: IMPRESSION:  1. No evidence of local endometrial cancer recurrence or metastasis within the abdomen or pelvis.  2. Colonic diverticulosis without diverticulitis.  3. Hepatic steatosis.  HEALTH MAINTENANCE: She is up-to-date on her mammograms   FAMILY HISTORY: There are no new medical problems.   Interval History:  She was last seen in June 2015 at which time her Pap smear was negative. She has persistent vaginal/vulvar pruritis for which she uses occasional nystatin/triamcinolone.  Review of Systems:  She occasionally has some swollen ankles but occasionally that seems to be worse when she is up and about quite a bit. 10 point review of systems is otherwise negative. Remainder of her ROS is as above.   Current Meds:  Outpatient Encounter Prescriptions as of 07/20/2014  Medication Sig  . aspirin 81 MG tablet Take 81 mg by mouth daily.  Marland Kitchen b complex vitamins tablet Take 1 tablet by mouth daily.  . Cholecalciferol (VITAMIN D-3 PO) Take 1 tablet by mouth daily.  . Coenzyme Q10 (COQ10) 200 MG CAPS Take by mouth. 1 by mouth 4-5  x weekly   . FLUoxetine (PROZAC) 20 MG capsule TAKE ONE CAPSULE BY MOUTH EVERY DAY  . GAMMA AMINOBUTYRIC ACID PO Take 500 mg by mouth daily.  Marland Kitchen loperamide (IMODIUM) 2 MG capsule Take 2 mg by mouth as needed.   . Melatonin 3 MG CAPS 2-3 po qhs  . metroNIDAZOLE (METROGEL) 0.75 % gel Apply 1 application topically 2 (two) times daily.  Marland Kitchen nystatin-triamcinolone ointment (MYCOLOG) Apply 1 application topically 2 (two) times daily.  . Omega-3 Fatty Acids (FISH OIL PO) Take 2 capsules by mouth 2 (two) times daily. 2 by mouth three times daily  . RESVERATROL PO Take 2 tablets by mouth daily.  Marland Kitchen thyroid (ARMOUR THYROID) 60 MG tablet Take 1 tablet (60 mg total) by mouth daily.  . TURMERIC PO Take 2 tablets by mouth 2 (two) times daily.   . VENTOLIN HFA 108 (90 BASE) MCG/ACT inhaler INHALE 2 PUFFS EVERY 4 HOURS AS NEEDED FOR WHEEZING  . fluticasone (FLONASE) 50 MCG/ACT nasal spray Place 1 spray into the nose as directed. 1 spray in each nostril twice a day as needed. Use the "crossover" technique as discussed  . MILK THISTLE PO Take 6 capsules by mouth daily.  Marland Kitchen OVER THE COUNTER MEDICATION Take 2 capsules by mouth daily. Over the counter "Zyflamend" herbal joint health   No facility-administered encounter medications on file as of 07/20/2014.    Allergy:  Allergies  Allergen Reactions  . Levofloxacin     REACTION: Rash (urticaria)  Social Hx:   History   Social History  . Marital Status: Married    Spouse Name: N/A  . Number of Children: N/A  . Years of Education: N/A   Occupational History  . counselor    Social History Main Topics  . Smoking status: Former Smoker -- 1.00 packs/day for 25 years    Types: Cigarettes    Quit date: 01/30/1993  . Smokeless tobacco: Not on file  . Alcohol Use: 8.4 oz/week    14 Cans of beer per week  . Drug Use: No  . Sexual Activity: No   Other Topics Concern  . Not on file   Social History Narrative    Past Surgical Hx:  Past Surgical  History  Procedure Laterality Date  . Total abdominal hysterectomy w/ bilateral salpingoophorectomy  2010  . Abdominal hysterectomy  04/2008    TAH BSO    Past Medical Hx:  Past Medical History  Diagnosis Date  . History of uterine cancer   . Allergic rhinitis   . GERD (gastroesophageal reflux disease)   . Hypothyroidism     Cytomel Rx'ed by Dr.Vaughan  . Depression   . Cancer 04/2008    uterine    Family Hx:  Family History  Problem Relation Age of Onset  . Alcohol abuse Father   . Alcohol abuse Mother   . Arthritis Mother   . Stroke Mother   . Diabetes Mother   . Cancer Paternal Grandfather     stomach    Vitals:  Blood pressure 151/65, pulse 75, temperature 98.2 F (36.8 C), temperature source Oral, resp. rate 16, height 5\' 6"  (1.676 m), weight 214 lb 9.6 oz (97.342 kg).  Physical Exam:  GENERAL: Well-nourished, well-developed female, in no acute distress.   NECK: Supple. There is no lymphadenopathy, no thyromegaly.   LUNGS: Clear to auscultation bilaterally.   CARDIOVASCULAR EXAM: Regular rate and rhythm.   ABDOMEN: Soft, nontender, non-distended with no palpable mass or hepatosplenomegaly. Exam is somewhat limited by habitus. Groins are negative for adenopathy.   EXTREMITIES: There is 1 to 2+ nonpitting edema equal bilaterally.   PELVIC: External genitalia is within normal limits with mild erythema L>R, no lesions. Vagina is markedly atrophic. The vaginal cuff is visualized. There are no visible lesions. Pap smear submitted without difficulty. Bimanual examination reveals no masses or nodularity. Rectal confirms.   Assessment/Plan:  A 64 year old with stage IIIC endometrioid adenocarcinoma diagnosed and treated in 2010 who is beyond her 5 year anniversary. She is NED for recurrence. I discussed with her that we will continue regular surveillance annually.     Donaciano Eva, MD 07/20/2014, 11:22 AM

## 2014-07-27 ENCOUNTER — Other Ambulatory Visit: Payer: Self-pay

## 2015-04-15 ENCOUNTER — Ambulatory Visit: Payer: Medicare Other

## 2015-04-29 ENCOUNTER — Telehealth: Payer: Self-pay

## 2015-04-29 NOTE — Telephone Encounter (Signed)
Pre VIsit call completed with patient. 

## 2015-04-30 ENCOUNTER — Encounter: Payer: Self-pay | Admitting: Family Medicine

## 2015-04-30 ENCOUNTER — Other Ambulatory Visit (HOSPITAL_COMMUNITY)
Admission: RE | Admit: 2015-04-30 | Discharge: 2015-04-30 | Disposition: A | Discharge status: Home / Self Care | Payer: Medicare Other | Source: Ambulatory Visit | Attending: Family Medicine | Admitting: Family Medicine

## 2015-04-30 ENCOUNTER — Ambulatory Visit (INDEPENDENT_AMBULATORY_CARE_PROVIDER_SITE_OTHER): Payer: Medicare Other | Admitting: Family Medicine

## 2015-04-30 VITALS — BP 143/90 | HR 80 | Temp 98.9°F | Ht 66.5 in | Wt 211.4 lb

## 2015-04-30 DIAGNOSIS — F329 Major depressive disorder, single episode, unspecified: Secondary | ICD-10-CM

## 2015-04-30 DIAGNOSIS — C541 Malignant neoplasm of endometrium: Secondary | ICD-10-CM

## 2015-04-30 DIAGNOSIS — R9431 Abnormal electrocardiogram [ECG] [EKG]: Secondary | ICD-10-CM | POA: Diagnosis not present

## 2015-04-30 DIAGNOSIS — H538 Other visual disturbances: Secondary | ICD-10-CM

## 2015-04-30 DIAGNOSIS — Z124 Encounter for screening for malignant neoplasm of cervix: Secondary | ICD-10-CM | POA: Insufficient documentation

## 2015-04-30 DIAGNOSIS — F32A Depression, unspecified: Secondary | ICD-10-CM

## 2015-04-30 DIAGNOSIS — E039 Hypothyroidism, unspecified: Secondary | ICD-10-CM

## 2015-04-30 DIAGNOSIS — Z1159 Encounter for screening for other viral diseases: Secondary | ICD-10-CM

## 2015-04-30 DIAGNOSIS — L719 Rosacea, unspecified: Secondary | ICD-10-CM

## 2015-04-30 DIAGNOSIS — Z0000 Encounter for general adult medical examination without abnormal findings: Secondary | ICD-10-CM

## 2015-04-30 DIAGNOSIS — Z114 Encounter for screening for human immunodeficiency virus [HIV]: Secondary | ICD-10-CM

## 2015-04-30 LAB — POCT URINALYSIS DIPSTICK
BILIRUBIN UA: NEGATIVE
Blood, UA: NEGATIVE
GLUCOSE UA: NEGATIVE
KETONES UA: NEGATIVE
LEUKOCYTES UA: NEGATIVE
Nitrite, UA: NEGATIVE
PROTEIN UA: NEGATIVE
Spec Grav, UA: >=1.03
Urobilinogen, UA: 0.2
pH, UA: 6

## 2015-04-30 LAB — CBC WITH DIFFERENTIAL/PLATELET
BASOS PCT: 1 % (ref 0–1)
Basophils Absolute: 0.1 10*3/uL (ref 0.0–0.1)
EOS PCT: 4 % (ref 0–5)
Eosinophils Absolute: 0.2 10*3/uL (ref 0.0–0.7)
HEMATOCRIT: 42.6 % (ref 36.0–46.0)
Hemoglobin: 14.4 g/dL (ref 12.0–15.0)
Lymphocytes Relative: 30 % (ref 12–46)
Lymphs Abs: 1.6 10*3/uL (ref 0.7–4.0)
MCH: 31.1 pg (ref 26.0–34.0)
MCHC: 33.8 g/dL (ref 30.0–36.0)
MCV: 92 fL (ref 78.0–100.0)
MONO ABS: 0.4 10*3/uL (ref 0.1–1.0)
MPV: 9.1 fL (ref 8.6–12.4)
Monocytes Relative: 8 % (ref 3–12)
Neutro Abs: 3 10*3/uL (ref 1.7–7.7)
Neutrophils Relative %: 57 % (ref 43–77)
Platelets: 283 10*3/uL (ref 150–400)
RBC: 4.63 MIL/uL (ref 3.87–5.11)
RDW: 13.2 % (ref 11.5–15.5)
WBC: 5.3 10*3/uL (ref 4.0–10.5)

## 2015-04-30 MED ORDER — METRONIDAZOLE 0.75 % EX GEL: 1.0000 "application " | Freq: Two times a day (BID) | CUTANEOUS | Status: AC

## 2015-04-30 MED ORDER — ALBUTEROL SULFATE HFA 108 (90 BASE) MCG/ACT IN AERS: INHALATION_SPRAY | RESPIRATORY_TRACT | Status: DC

## 2015-04-30 MED ORDER — THYROID 60 MG PO TABS: 60.0000 mg | ORAL_TABLET | Freq: Every day | ORAL | Status: DC

## 2015-04-30 MED ORDER — THYROID 60 MG PO TABS
60.0000 mg | ORAL_TABLET | Freq: Every day | ORAL | Status: DC
Start: 2015-04-30 — End: 2016-04-04

## 2015-04-30 MED ORDER — NYSTATIN-TRIAMCINOLONE 100000-0.1 UNIT/GM-% EX OINT: 1.0000 "application " | TOPICAL_OINTMENT | Freq: Two times a day (BID) | CUTANEOUS | Status: DC

## 2015-04-30 MED ORDER — FLUOXETINE HCL 20 MG PO CAPS: ORAL_CAPSULE | ORAL | Status: AC

## 2015-04-30 NOTE — Progress Notes (Signed)
Subjective:    Patient ID: [name], female    DOB: 10-Nov-1950, 65 y.o.   MRN: [id number]  No chief complaint on file.   HPI Patient is in today for [employer] Welcome visit.  Past Medical History  Diagnosis Date  . History of uterine cancer   . Allergic rhinitis   . GERD (gastroesophageal reflux disease)   . Hypothyroidism     Cytomel Rx'ed by Dr.Vaughan  . Depression   . Cancer 04/2008    uterine    Past Surgical History  Procedure Laterality Date  . Total abdominal hysterectomy w/ bilateral salpingoophorectomy  2010  . Abdominal hysterectomy  04/2008    TAH BSO    Family History  Problem Relation Age of Onset  . Alcohol abuse Father   . Alcohol abuse Mother   . Arthritis Mother   . Stroke Mother   . Diabetes Mother   . Cancer Paternal Grandfather     stomach    Social History   Social History  . Marital Status: Married    Spouse Name: N/A  . Number of Children: N/A  . Years of Education: N/A   Occupational History  . counselor    Social History Main Topics  . Smoking status: Former Smoker -- 1.00 packs/day for 25 years    Types: Cigarettes    Quit date: 01/30/1993  . Smokeless tobacco: Not on file  . Alcohol Use: 8.4 oz/week    14 Cans of beer per week  . Drug Use: No  . Sexual Activity: No   Other Topics Concern  . Not on file   Social History Narrative    Outpatient Prescriptions Prior to Visit  Medication Sig Dispense Refill  . aspirin 81 MG tablet Take 81 mg by mouth daily. Reported on 04/29/2015    . b complex vitamins tablet Take 1 tablet by mouth daily.    . Cholecalciferol (VITAMIN D-3 PO) Take 1 tablet by mouth daily.    . Coenzyme Q10 (COQ10) 200 MG CAPS Take by mouth. 1 by mouth 4-5 x weekly     . FLUoxetine (PROZAC) 20 MG capsule TAKE ONE CAPSULE BY MOUTH EVERY DAY 90 capsule 3  . fluticasone (FLONASE) 50 MCG/ACT nasal spray Place 1 spray into the nose as directed. 1 spray in each nostril twice a day as needed.  Use the "crossover" technique as discussed 16 g 2  . GAMMA AMINOBUTYRIC ACID PO Take 500 mg by mouth daily.    Marland Kitchen loperamide (IMODIUM) 2 MG capsule Take 2 mg by mouth as needed.     . Melatonin 3 MG CAPS 2-3 po qhs (Patient not taking: Reported on 04/29/2015)  0  . metroNIDAZOLE (METROGEL) 0.75 % gel Apply 1 application topically 2 (two) times daily. 45 g 5  . MILK THISTLE PO Take 6 capsules by mouth daily. Reported on 04/29/2015    . nystatin-triamcinolone ointment (MYCOLOG) Apply 1 application topically 2 (two) times daily. 30 g 12  . Omega-3 Fatty Acids (FISH OIL PO) Take 2 capsules by mouth 2 (two) times daily. 2 by mouth three times daily    . OVER THE COUNTER MEDICATION Take 2 capsules by mouth daily. Over the counter "Zyflamend" herbal joint health    . RESVERATROL PO Take 2 tablets by mouth daily. Reported on 04/29/2015    . thyroid (ARMOUR THYROID) 60 MG tablet Take 1 tablet (60 mg total) by mouth daily. 90 tablet 3  . TURMERIC PO Take 2  tablets by mouth 2 (two) times daily.     . VENTOLIN HFA 108 (90 BASE) MCG/ACT inhaler INHALE 2 PUFFS EVERY 4 HOURS AS NEEDED FOR WHEEZING (Patient not taking: Reported on 04/29/2015) 18 each 1   No facility-administered medications prior to visit.    Allergies  Allergen Reactions  . Levofloxacin     REACTION: Rash (urticaria)    ROS     Objective:    Physical Exam  There were no vitals taken for this visit. Wt Readings from Last 3 Encounters:  07/20/14 214 lb 9.6 oz (97.342 kg)  05/04/14 210 lb 12.8 oz (95.618 kg)  07/17/13 209 lb 3.2 oz (94.892 kg)     Lab Results  Component Value Date   WBC 5.6 05/04/2014   HGB 14.8 05/04/2014   HCT 42.8 05/04/2014   PLT 288.0 05/04/2014   GLUCOSE 106* 05/04/2014   CHOL 235* 05/04/2014   TRIG 159.0* 05/04/2014   HDL 61.70 05/04/2014   LDLDIRECT 141.5 03/21/2013   LDLCALC 142* 05/04/2014   ALT 26 05/04/2014   AST 23 05/04/2014   NA 139 05/04/2014   K 4.3 05/04/2014   CL 105 05/04/2014    CREATININE 0.62 05/04/2014   BUN 16 05/04/2014   CO2 28 05/04/2014   TSH 1.26 05/04/2014   HGBA1C 5.4 03/21/2013    Lab Results  Component Value Date   TSH 1.26 05/04/2014   Lab Results  Component Value Date   WBC 5.6 05/04/2014   HGB 14.8 05/04/2014   HCT 42.8 05/04/2014   MCV 91.8 05/04/2014   PLT 288.0 05/04/2014   Lab Results  Component Value Date   NA 139 05/04/2014   K 4.3 05/04/2014   CO2 28 05/04/2014   GLUCOSE 106* 05/04/2014   BUN 16 05/04/2014   CREATININE 0.62 05/04/2014   BILITOT 0.6 05/04/2014   ALKPHOS 52 05/04/2014   AST 23 05/04/2014   ALT 26 05/04/2014   PROT 7.2 05/04/2014   ALBUMIN 3.9 05/04/2014   CALCIUM 9.5 05/04/2014   GFR 102.69 05/04/2014   Lab Results  Component Value Date   CHOL 235* 05/04/2014   Lab Results  Component Value Date   HDL 61.70 05/04/2014   Lab Results  Component Value Date   LDLCALC 142* 05/04/2014   Lab Results  Component Value Date   TRIG 159.0* 05/04/2014   Lab Results  Component Value Date   CHOLHDL 4 05/04/2014   Lab Results  Component Value Date   HGBA1C 5.4 03/21/2013       Assessment & Plan:   Problem List Items Addressed This Visit    None      I am having [name] maintain her Omega-3 Fatty Acids (FISH OIL PO), CoQ10, loperamide, Melatonin, fluticasone, MILK THISTLE PO, TURMERIC PO, Cholecalciferol (VITAMIN D-3 PO), RESVERATROL PO, b complex vitamins, GAMMA AMINOBUTYRIC ACID PO, OVER THE COUNTER MEDICATION, aspirin, VENTOLIN HFA, FLUoxetine, nystatin-triamcinolone ointment, metroNIDAZOLE, and thyroid.  No orders of the defined types were placed in this encounter.     Jan Fireman, [place]

## 2015-04-30 NOTE — Patient Instructions (Signed)
Preventive Care for Adults, Female A healthy lifestyle and preventive care can promote health and wellness. Preventive health guidelines for women include the following key practices.  A routine yearly physical is a good way to check with your health care provider about your health and preventive screening. It is a chance to share any concerns and updates on your health and to receive a thorough exam.  Visit your dentist for a routine exam and preventive care every 6 months. Brush your teeth twice a day and floss once a day. Good oral hygiene prevents tooth decay and gum disease.  The frequency of eye exams is based on your age, health, family medical history, use of contact lenses, and other factors. Follow your health care provider's recommendations for frequency of eye exams.  Eat a healthy diet. Foods like vegetables, fruits, whole grains, low-fat dairy products, and lean protein foods contain the nutrients you need without too many calories. Decrease your intake of foods high in solid fats, added sugars, and salt. Eat the right amount of calories for you.Get information about a proper diet from your health care provider, if necessary.  Regular physical exercise is one of the most important things you can do for your health. Most adults should get at least 150 minutes of moderate-intensity exercise (any activity that increases your heart rate and causes you to sweat) each week. In addition, most adults need muscle-strengthening exercises on 2 or more days a week.  Maintain a healthy weight. The body mass index (BMI) is a screening tool to identify possible weight problems. It provides an estimate of body fat based on height and weight. Your health care provider can find your BMI and can help you achieve or maintain a healthy weight.For adults 20 years and older:  A BMI below 18.5 is considered underweight.  A BMI of 18.5 to 24.9 is normal.  A BMI of 25 to 29.9 is considered overweight.  A  BMI of 30 and above is considered obese.  Maintain normal blood lipids and cholesterol levels by exercising and minimizing your intake of saturated fat. Eat a balanced diet with plenty of fruit and vegetables. Blood tests for lipids and cholesterol should begin at age 45 and be repeated every 5 years. If your lipid or cholesterol levels are high, you are over 50, or you are at high risk for heart disease, you may need your cholesterol levels checked more frequently.Ongoing high lipid and cholesterol levels should be treated with medicines if diet and exercise are not working.  If you smoke, find out from your health care provider how to quit. If you do not use tobacco, do not start.  Lung cancer screening is recommended for adults aged 45-80 years who are at high risk for developing lung cancer because of a history of smoking. A yearly low-dose CT scan of the lungs is recommended for people who have at least a 30-pack-year history of smoking and are a current smoker or have quit within the past 15 years. A pack year of smoking is smoking an average of 1 pack of cigarettes a day for 1 year (for example: 1 pack a day for 30 years or 2 packs a day for 15 years). Yearly screening should continue until the smoker has stopped smoking for at least 15 years. Yearly screening should be stopped for people who develop a health problem that would prevent them from having lung cancer treatment.  If you are pregnant, do not drink alcohol. If you are  breastfeeding, be very cautious about drinking alcohol. If you are not pregnant and choose to drink alcohol, do not have more than 1 drink per day. One drink is considered to be 12 ounces (355 mL) of beer, 5 ounces (148 mL) of wine, or 1.5 ounces (44 mL) of liquor.  Avoid use of street drugs. Do not share needles with anyone. Ask for help if you need support or instructions about stopping the use of drugs.  High blood pressure causes heart disease and increases the risk  of stroke. Your blood pressure should be checked at least every 1 to 2 years. Ongoing high blood pressure should be treated with medicines if weight loss and exercise do not work.  If you are 55-79 years old, ask your health care provider if you should take aspirin to prevent strokes.  Diabetes screening is done by taking a blood sample to check your blood glucose level after you have not eaten for a certain period of time (fasting). If you are not overweight and you do not have risk factors for diabetes, you should be screened once every 3 years starting at age 45. If you are overweight or obese and you are 40-70 years of age, you should be screened for diabetes every year as part of your cardiovascular risk assessment.  Breast cancer screening is essential preventive care for women. You should practice "breast self-awareness." This means understanding the normal appearance and feel of your breasts and may include breast self-examination. Any changes detected, no matter how small, should be reported to a health care provider. Women in their 20s and 30s should have a clinical breast exam (CBE) by a health care provider as part of a regular health exam every 1 to 3 years. After age 40, women should have a CBE every year. Starting at age 40, women should consider having a mammogram (breast X-ray test) every year. Women who have a family history of breast cancer should talk to their health care provider about genetic screening. Women at a high risk of breast cancer should talk to their health care providers about having an MRI and a mammogram every year.  Breast cancer gene (BRCA)-related cancer risk assessment is recommended for women who have family members with BRCA-related cancers. BRCA-related cancers include breast, ovarian, tubal, and peritoneal cancers. Having family members with these cancers may be associated with an increased risk for harmful changes (mutations) in the breast cancer genes BRCA1 and  BRCA2. Results of the assessment will determine the need for genetic counseling and BRCA1 and BRCA2 testing.  Your health care provider may recommend that you be screened regularly for cancer of the pelvic organs (ovaries, uterus, and vagina). This screening involves a pelvic examination, including checking for microscopic changes to the surface of your cervix (Pap test). You may be encouraged to have this screening done every 3 years, beginning at age 21.  For women ages 30-65, health care providers may recommend pelvic exams and Pap testing every 3 years, or they may recommend the Pap and pelvic exam, combined with testing for human papilloma virus (HPV), every 5 years. Some types of HPV increase your risk of cervical cancer. Testing for HPV may also be done on women of any age with unclear Pap test results.  Other health care providers may not recommend any screening for nonpregnant women who are considered low risk for pelvic cancer and who do not have symptoms. Ask your health care provider if a screening pelvic exam is right for   you.  If you have had past treatment for cervical cancer or a condition that could lead to cancer, you need Pap tests and screening for cancer for at least 20 years after your treatment. If Pap tests have been discontinued, your risk factors (such as having a new sexual partner) need to be reassessed to determine if screening should resume. Some women have medical problems that increase the chance of getting cervical cancer. In these cases, your health care provider may recommend more frequent screening and Pap tests.  Colorectal cancer can be detected and often prevented. Most routine colorectal cancer screening begins at the age of 50 years and continues through age 75 years. However, your health care provider may recommend screening at an earlier age if you have risk factors for colon cancer. On a yearly basis, your health care provider may provide home test kits to check  for hidden blood in the stool. Use of a small camera at the end of a tube, to directly examine the colon (sigmoidoscopy or colonoscopy), can detect the earliest forms of colorectal cancer. Talk to your health care provider about this at age 50, when routine screening begins. Direct exam of the colon should be repeated every 5-10 years through age 75 years, unless early forms of precancerous polyps or small growths are found.  People who are at an increased risk for hepatitis B should be screened for this virus. You are considered at high risk for hepatitis B if:  You were born in a country where hepatitis B occurs often. Talk with your health care provider about which countries are considered high risk.  Your parents were born in a high-risk country and you have not received a shot to protect against hepatitis B (hepatitis B vaccine).  You have HIV or AIDS.  You use needles to inject street drugs.  You live with, or have sex with, someone who has hepatitis B.  You get hemodialysis treatment.  You take certain medicines for conditions like cancer, organ transplantation, and autoimmune conditions.  Hepatitis C blood testing is recommended for all people born from 1945 through 1965 and any individual with known risks for hepatitis C.  Practice safe sex. Use condoms and avoid high-risk sexual practices to reduce the spread of sexually transmitted infections (STIs). STIs include gonorrhea, chlamydia, syphilis, trichomonas, herpes, HPV, and human immunodeficiency virus (HIV). Herpes, HIV, and HPV are viral illnesses that have no cure. They can result in disability, cancer, and death.  You should be screened for sexually transmitted illnesses (STIs) including gonorrhea and chlamydia if:  You are sexually active and are younger than 24 years.  You are older than 24 years and your health care provider tells you that you are at risk for this type of infection.  Your sexual activity has changed  since you were last screened and you are at an increased risk for chlamydia or gonorrhea. Ask your health care provider if you are at risk.  If you are at risk of being infected with HIV, it is recommended that you take a prescription medicine daily to prevent HIV infection. This is called preexposure prophylaxis (PrEP). You are considered at risk if:  You are sexually active and do not regularly use condoms or know the HIV status of your partner(s).  You take drugs by injection.  You are sexually active with a partner who has HIV.  Talk with your health care provider about whether you are at high risk of being infected with HIV. If   you choose to begin PrEP, you should first be tested for HIV. You should then be tested every 3 months for as long as you are taking PrEP.  Osteoporosis is a disease in which the bones lose minerals and strength with aging. This can result in serious bone fractures or breaks. The risk of osteoporosis can be identified using a bone density scan. Women ages 67 years and over and women at risk for fractures or osteoporosis should discuss screening with their health care providers. Ask your health care provider whether you should take a calcium supplement or vitamin D to reduce the rate of osteoporosis.  Menopause can be associated with physical symptoms and risks. Hormone replacement therapy is available to decrease symptoms and risks. You should talk to your health care provider about whether hormone replacement therapy is right for you.  Use sunscreen. Apply sunscreen liberally and repeatedly throughout the day. You should seek shade when your shadow is shorter than you. Protect yourself by wearing long sleeves, pants, a wide-brimmed hat, and sunglasses year round, whenever you are outdoors.  Once a month, do a whole body skin exam, using a mirror to look at the skin on your back. Tell your health care provider of new moles, moles that have irregular borders, moles that  are larger than a pencil eraser, or moles that have changed in shape or color.  Stay current with required vaccines (immunizations).  Influenza vaccine. All adults should be immunized every year.  Tetanus, diphtheria, and acellular pertussis (Td, Tdap) vaccine. Pregnant women should receive 1 dose of Tdap vaccine during each pregnancy. The dose should be obtained regardless of the length of time since the last dose. Immunization is preferred during the 27th-36th week of gestation. An adult who has not previously received Tdap or who does not know her vaccine status should receive 1 dose of Tdap. This initial dose should be followed by tetanus and diphtheria toxoids (Td) booster doses every 10 years. Adults with an unknown or incomplete history of completing a 3-dose immunization series with Td-containing vaccines should begin or complete a primary immunization series including a Tdap dose. Adults should receive a Td booster every 10 years.  Varicella vaccine. An adult without evidence of immunity to varicella should receive 2 doses or a second dose if she has previously received 1 dose. Pregnant females who do not have evidence of immunity should receive the first dose after pregnancy. This first dose should be obtained before leaving the health care facility. The second dose should be obtained 4-8 weeks after the first dose.  Human papillomavirus (HPV) vaccine. Females aged 13-26 years who have not received the vaccine previously should obtain the 3-dose series. The vaccine is not recommended for use in pregnant females. However, pregnancy testing is not needed before receiving a dose. If a female is found to be pregnant after receiving a dose, no treatment is needed. In that case, the remaining doses should be delayed until after the pregnancy. Immunization is recommended for any person with an immunocompromised condition through the age of 61 years if she did not get any or all doses earlier. During the  3-dose series, the second dose should be obtained 4-8 weeks after the first dose. The third dose should be obtained 24 weeks after the first dose and 16 weeks after the second dose.  Zoster vaccine. One dose is recommended for adults aged 30 years or older unless certain conditions are present.  Measles, mumps, and rubella (MMR) vaccine. Adults born  before 1957 generally are considered immune to measles and mumps. Adults born in 1957 or later should have 1 or more doses of MMR vaccine unless there is a contraindication to the vaccine or there is laboratory evidence of immunity to each of the three diseases. A routine second dose of MMR vaccine should be obtained at least 28 days after the first dose for students attending postsecondary schools, health care workers, or international travelers. People who received inactivated measles vaccine or an unknown type of measles vaccine during 1963-1967 should receive 2 doses of MMR vaccine. People who received inactivated mumps vaccine or an unknown type of mumps vaccine before 1979 and are at high risk for mumps infection should consider immunization with 2 doses of MMR vaccine. For females of childbearing age, rubella immunity should be determined. If there is no evidence of immunity, females who are not pregnant should be vaccinated. If there is no evidence of immunity, females who are pregnant should delay immunization until after pregnancy. Unvaccinated health care workers born before 1957 who lack laboratory evidence of measles, mumps, or rubella immunity or laboratory confirmation of disease should consider measles and mumps immunization with 2 doses of MMR vaccine or rubella immunization with 1 dose of MMR vaccine.  Pneumococcal 13-valent conjugate (PCV13) vaccine. When indicated, a person who is uncertain of his immunization history and has no record of immunization should receive the PCV13 vaccine. All adults 65 years of age and older should receive this  vaccine. An adult aged 19 years or older who has certain medical conditions and has not been previously immunized should receive 1 dose of PCV13 vaccine. This PCV13 should be followed with a dose of pneumococcal polysaccharide (PPSV23) vaccine. Adults who are at high risk for pneumococcal disease should obtain the PPSV23 vaccine at least 8 weeks after the dose of PCV13 vaccine. Adults older than 65 years of age who have normal immune system function should obtain the PPSV23 vaccine dose at least 1 year after the dose of PCV13 vaccine.  Pneumococcal polysaccharide (PPSV23) vaccine. When PCV13 is also indicated, PCV13 should be obtained first. All adults aged 65 years and older should be immunized. An adult younger than age 65 years who has certain medical conditions should be immunized. Any person who resides in a nursing home or long-term care facility should be immunized. An adult smoker should be immunized. People with an immunocompromised condition and certain other conditions should receive both PCV13 and PPSV23 vaccines. People with human immunodeficiency virus (HIV) infection should be immunized as soon as possible after diagnosis. Immunization during chemotherapy or radiation therapy should be avoided. Routine use of PPSV23 vaccine is not recommended for American Indians, [place] Natives, or people younger than 65 years unless there are medical conditions that require PPSV23 vaccine. When indicated, people who have unknown immunization and have no record of immunization should receive PPSV23 vaccine. One-time revaccination 5 years after the first dose of PPSV23 is recommended for people aged 19-64 years who have chronic kidney failure, nephrotic syndrome, asplenia, or immunocompromised conditions. People who received 1-2 doses of PPSV23 before age 65 years should receive another dose of PPSV23 vaccine at age 65 years or later if at least 5 years have passed since the previous dose. Doses of PPSV23 are not  needed for people immunized with PPSV23 at or after age 65 years.  Meningococcal vaccine. Adults with asplenia or persistent complement component deficiencies should receive 2 doses of quadrivalent meningococcal conjugate (MenACWY-D) vaccine. The doses should be obtained   at least 2 months apart. Microbiologists working with certain meningococcal bacteria, [place] recruits, people at risk during an outbreak, and people who travel to or live in countries with a high rate of meningitis should be immunized. A first-year college student up through age 34 years who is living in a residence hall should receive a dose if she did not receive a dose on or after her 16th birthday. Adults who have certain high-risk conditions should receive one or more doses of vaccine.  Hepatitis A vaccine. Adults who wish to be protected from this disease, have certain high-risk conditions, work with hepatitis A-infected animals, work in hepatitis A research labs, or travel to or work in countries with a high rate of hepatitis A should be immunized. Adults who were previously unvaccinated and who anticipate close contact with an international adoptee during the first 60 days after arrival in the [place] from a country with a high rate of hepatitis A should be immunized.  Hepatitis B vaccine. Adults who wish to be protected from this disease, have certain high-risk conditions, may be exposed to blood or other infectious body fluids, are household contacts or sex partners of hepatitis B positive people, are clients or workers in certain care facilities, or travel to or work in countries with a high rate of hepatitis B should be immunized.  Haemophilus influenzae type b (Hib) vaccine. A previously unvaccinated person with asplenia or sickle cell disease or having a scheduled splenectomy should receive 1 dose of Hib vaccine. Regardless of previous immunization, a recipient of a hematopoietic stem cell transplant should receive a  3-dose series 6-12 months after her successful transplant. Hib vaccine is not recommended for adults with HIV infection. Preventive Services / Frequency Ages 35 to 4 years  Blood pressure check.** / Every 3-5 years.  Lipid and cholesterol check.** / Every 5 years beginning at age 60.  Clinical breast exam.** / Every 3 years for women in their 71s and 10s.  BRCA-related cancer risk assessment.** / For women who have family members with a BRCA-related cancer (breast, ovarian, tubal, or peritoneal cancers).  Pap test.** / Every 2 years from ages 76 through 26. Every 3 years starting at age 61 through age 76 or 93 with a history of 3 consecutive normal Pap tests.  HPV screening.** / Every 3 years from ages 37 through ages 60 to 51 with a history of 3 consecutive normal Pap tests.  Hepatitis C blood test.** / For any individual with known risks for hepatitis C.  Skin self-exam. / Monthly.  Influenza vaccine. / Every year.  Tetanus, diphtheria, and acellular pertussis (Tdap, Td) vaccine.** / Consult your health care provider. Pregnant women should receive 1 dose of Tdap vaccine during each pregnancy. 1 dose of Td every 10 years.  Varicella vaccine.** / Consult your health care provider. Pregnant females who do not have evidence of immunity should receive the first dose after pregnancy.  HPV vaccine. / 3 doses over 6 months, if 93 and younger. The vaccine is not recommended for use in pregnant females. However, pregnancy testing is not needed before receiving a dose.  Measles, mumps, rubella (MMR) vaccine.** / You need at least 1 dose of MMR if you were born in 1957 or later. You may also need a 2nd dose. For females of childbearing age, rubella immunity should be determined. If there is no evidence of immunity, females who are not pregnant should be vaccinated. If there is no evidence of immunity, females who are  pregnant should delay immunization until after pregnancy.  Pneumococcal  13-valent conjugate (PCV13) vaccine.** / Consult your health care provider.  Pneumococcal polysaccharide (PPSV23) vaccine.** / 1 to 2 doses if you smoke cigarettes or if you have certain conditions.  Meningococcal vaccine.** / 1 dose if you are age 68 to 8 years and a Market researcher living in a residence hall, or have one of several medical conditions, you need to get vaccinated against meningococcal disease. You may also need additional booster doses.  Hepatitis A vaccine.** / Consult your health care provider.  Hepatitis B vaccine.** / Consult your health care provider.  Haemophilus influenzae type b (Hib) vaccine.** / Consult your health care provider. Ages 7 to 53 years  Blood pressure check.** / Every year.  Lipid and cholesterol check.** / Every 5 years beginning at age 25 years.  Lung cancer screening. / Every year if you are aged 11-80 years and have a 30-pack-year history of smoking and currently smoke or have quit within the past 15 years. Yearly screening is stopped once you have quit smoking for at least 15 years or develop a health problem that would prevent you from having lung cancer treatment.  Clinical breast exam.** / Every year after age 48 years.  BRCA-related cancer risk assessment.** / For women who have family members with a BRCA-related cancer (breast, ovarian, tubal, or peritoneal cancers).  Mammogram.** / Every year beginning at age 41 years and continuing for as long as you are in good health. Consult with your health care provider.  Pap test.** / Every 3 years starting at age 65 years through age 37 or 70 years with a history of 3 consecutive normal Pap tests.  HPV screening.** / Every 3 years from ages 72 years through ages 60 to 40 years with a history of 3 consecutive normal Pap tests.  Fecal occult blood test (FOBT) of stool. / Every year beginning at age 21 years and continuing until age 5 years. You may not need to do this test if you get  a colonoscopy every 10 years.  Flexible sigmoidoscopy or colonoscopy.** / Every 5 years for a flexible sigmoidoscopy or every 10 years for a colonoscopy beginning at age 35 years and continuing until age 48 years.  Hepatitis C blood test.** / For all people born from [birth year] through 1965 and any individual with known risks for hepatitis C.  Skin self-exam. / Monthly.  Influenza vaccine. / Every year.  Tetanus, diphtheria, and acellular pertussis (Tdap/Td) vaccine.** / Consult your health care provider. Pregnant women should receive 1 dose of Tdap vaccine during each pregnancy. 1 dose of Td every 10 years.  Varicella vaccine.** / Consult your health care provider. Pregnant females who do not have evidence of immunity should receive the first dose after pregnancy.  Zoster vaccine.** / 1 dose for adults aged 30 years or older.  Measles, mumps, rubella (MMR) vaccine.** / You need at least 1 dose of MMR if you were born in 1957 or later. You may also need a second dose. For females of childbearing age, rubella immunity should be determined. If there is no evidence of immunity, females who are not pregnant should be vaccinated. If there is no evidence of immunity, females who are pregnant should delay immunization until after pregnancy.  Pneumococcal 13-valent conjugate (PCV13) vaccine.** / Consult your health care provider.  Pneumococcal polysaccharide (PPSV23) vaccine.** / 1 to 2 doses if you smoke cigarettes or if you have certain conditions.  Meningococcal vaccine.** /  Consult your health care provider.  Hepatitis A vaccine.** / Consult your health care provider.  Hepatitis B vaccine.** / Consult your health care provider.  Haemophilus influenzae type b (Hib) vaccine.** / Consult your health care provider. Ages 64 years and over  Blood pressure check.** / Every year.  Lipid and cholesterol check.** / Every 5 years beginning at age 23 years.  Lung cancer screening. / Every year if you  are aged 16-80 years and have a 30-pack-year history of smoking and currently smoke or have quit within the past 15 years. Yearly screening is stopped once you have quit smoking for at least 15 years or develop a health problem that would prevent you from having lung cancer treatment.  Clinical breast exam.** / Every year after age 74 years.  BRCA-related cancer risk assessment.** / For women who have family members with a BRCA-related cancer (breast, ovarian, tubal, or peritoneal cancers).  Mammogram.** / Every year beginning at age 44 years and continuing for as long as you are in good health. Consult with your health care provider.  Pap test.** / Every 3 years starting at age 58 years through age 22 or 39 years with 3 consecutive normal Pap tests. Testing can be stopped between 65 and 70 years with 3 consecutive normal Pap tests and no abnormal Pap or HPV tests in the past 10 years.  HPV screening.** / Every 3 years from ages 64 years through ages 70 or 61 years with a history of 3 consecutive normal Pap tests. Testing can be stopped between 65 and 70 years with 3 consecutive normal Pap tests and no abnormal Pap or HPV tests in the past 10 years.  Fecal occult blood test (FOBT) of stool. / Every year beginning at age 40 years and continuing until age 27 years. You may not need to do this test if you get a colonoscopy every 10 years.  Flexible sigmoidoscopy or colonoscopy.** / Every 5 years for a flexible sigmoidoscopy or every 10 years for a colonoscopy beginning at age 7 years and continuing until age 32 years.  Hepatitis C blood test.** / For all people born from [birth year] through 1966 and any individual with known risks for hepatitis C.  Osteoporosis screening.** / A one-time screening for women ages 30 years and over and women at risk for fractures or osteoporosis.  Skin self-exam. / Monthly.  Influenza vaccine. / Every year.  Tetanus, diphtheria, and acellular pertussis (Tdap/Td)  vaccine.** / 1 dose of Td every 10 years.  Varicella vaccine.** / Consult your health care provider.  Zoster vaccine.** / 1 dose for adults aged 35 years or older.  Pneumococcal 13-valent conjugate (PCV13) vaccine.** / Consult your health care provider.  Pneumococcal polysaccharide (PPSV23) vaccine.** / 1 dose for all adults aged 46 years and older.  Meningococcal vaccine.** / Consult your health care provider.  Hepatitis A vaccine.** / Consult your health care provider.  Hepatitis B vaccine.** / Consult your health care provider.  Haemophilus influenzae type b (Hib) vaccine.** / Consult your health care provider. ** Family history and personal history of risk and conditions may change your health care provider's recommendations.   This information is not intended to replace advice given to you by your health care provider. Make sure you discuss any questions you have with your health care provider.   Document Released: 03/14/2001 Document Revised: 02/06/2014 Document Reviewed: 06/13/2010 Elsevier Interactive Patient Education [employer].

## 2015-04-30 NOTE — Progress Notes (Signed)
Subjective:    [name] is a 65 y.o. female who presents for a Welcome to Medicare exam.   Review of Systems  Review of Systems  Constitutional: Negative for activity change, appetite change and fatigue.  HENT: Negative for hearing loss, congestion, tinnitus and ear discharge.   Eyes: Negative for visual disturbance (see optho q1y -- vision corrected to 20/20 with glasses).  Respiratory: Negative for cough, chest tightness and shortness of breath.   Cardiovascular: Negative for chest pain, palpitations and leg swelling.  Gastrointestinal: Negative for abdominal pain, diarrhea, constipation and abdominal distention.  Genitourinary: Negative for urgency, frequency, decreased urine volume and difficulty urinating.  Musculoskeletal: Negative for back pain, arthralgias and gait problem.  Skin: Negative for color change, pallor and rash.  Neurological: Negative for dizziness, light-headedness, numbness and headaches.  Hematological: Negative for adenopathy. Does not bruise/bleed easily.  Psychiatric/Behavioral: Negative for suicidal ideas, confusion, sleep disturbance, self-injury, dysphoric mood, decreased concentration and agitation.  Pt is able to read and write and can do all ADLs No risk for falling No abuse/ violence in home    Cardiac Risk Factors include: advanced age (>86mn, >>58women);obesity (BMI >30kg/m2)      Objective:    Today's Vitals   04/30/15 1346  BP: 143/90  Pulse: 80  Temp: 98.9 F (37.2 C)  TempSrc: Oral  Height: 5' 6.5" (1.689 m)  Weight: 211 lb 6.4 oz (95.89 kg)  SpO2: 98%  Body mass index is 33.61 kg/(m^2).  Medications Outpatient Encounter Prescriptions as of 04/30/2015  Medication Sig  . albuterol (VENTOLIN HFA) 108 (90 Base) MCG/ACT inhaler INHALE 2 PUFFS EVERY 4 HOURS AS NEEDED FOR WHEEZING  . b complex vitamins tablet Take 1 tablet by mouth daily.  . Cholecalciferol (VITAMIN D-3 PO) Take 1 tablet by mouth daily.  . Coenzyme Q10  (COQ10) 200 MG CAPS Take by mouth. 1 by mouth 4-5 x weekly   . FLUoxetine (PROZAC) 20 MG capsule TAKE ONE CAPSULE BY MOUTH EVERY DAY  . GAMMA AMINOBUTYRIC ACID PO Take 500 mg by mouth daily.  .Marland Kitchenloperamide (IMODIUM) 2 MG capsule Take 2 mg by mouth as needed.   . Melatonin 3 MG CAPS 2-3 po qhs  . metroNIDAZOLE (METROGEL) 0.75 % gel Apply 1 application topically 2 (two) times daily.  .Marland KitchenMILK THISTLE PO Take 6 capsules by mouth daily. Reported on 04/29/2015  . nystatin-triamcinolone ointment (MYCOLOG) Apply 1 application topically 2 (two) times daily.  . Omega-3 Fatty Acids (FISH OIL PO) Take 2 capsules by mouth 2 (two) times daily. 2 by mouth three times daily  . OVER THE COUNTER MEDICATION Take 2 capsules by mouth daily. Over the counter "Zyflamend" herbal joint health  . RESVERATROL PO Take 2 tablets by mouth daily. Reported on 04/29/2015  . thyroid (ARMOUR THYROID) 60 MG tablet Take 1 tablet (60 mg total) by mouth daily.  . TURMERIC PO Take 2 tablets by mouth 2 (two) times daily.   . [DISCONTINUED] FLUoxetine (PROZAC) 20 MG capsule TAKE ONE CAPSULE BY MOUTH EVERY DAY  . [DISCONTINUED] metroNIDAZOLE (METROGEL) 0.75 % gel Apply 1 application topically 2 (two) times daily.  . [DISCONTINUED] nystatin-triamcinolone ointment (MYCOLOG) Apply 1 application topically 2 (two) times daily.  . [DISCONTINUED] thyroid (ARMOUR THYROID) 60 MG tablet Take 1 tablet (60 mg total) by mouth daily.  . [DISCONTINUED] thyroid (ARMOUR THYROID) 60 MG tablet Take 1 tablet (60 mg total) by mouth daily.  . [DISCONTINUED] VENTOLIN HFA 108 (90 BASE) MCG/ACT inhaler  INHALE 2 PUFFS EVERY 4 HOURS AS NEEDED FOR WHEEZING  . fluticasone (FLONASE) 50 MCG/ACT nasal spray Place 1 spray into the nose as directed. 1 spray in each nostril twice a day as needed. Use the "crossover" technique as discussed  . [DISCONTINUED] aspirin 81 MG tablet Take 81 mg by mouth daily. Reported on 04/29/2015   No facility-administered encounter medications  on file as of 04/30/2015.     History: Past Medical History  Diagnosis Date  . Allergic rhinitis   . GERD (gastroesophageal reflux disease)   . Hypothyroidism     Cytomel Rx'ed by Dr.Vaughan  . Depression   . History of uterine cancer   . Cancer ([place]) 04/2008    uterine  . Asthma    Past Surgical History  Procedure Laterality Date  . Total abdominal hysterectomy w/ bilateral salpingoophorectomy  2010  . Abdominal hysterectomy  04/2008    TAH BSO    Family History  Problem Relation Age of Onset  . Alcohol abuse Father   . Alcohol abuse Mother   . Arthritis Mother   . Stroke Mother   . Diabetes Mother   . Cancer Paternal Grandfather     stomach   Social History   Occupational History  . counselor    Social History Main Topics  . Smoking status: Former Smoker -- 1.00 packs/day for 25 years    Types: Cigarettes    Quit date: 01/30/1993  . Smokeless tobacco: Not on file  . Alcohol Use: 8.4 oz/week    14 Cans of beer per week  . Drug Use: No  . Sexual Activity: No    Tobacco Counseling Counseling given: Not Answered   Immunizations and Health Maintenance  There is no immunization history on file for this patient. Health Maintenance Due  Topic Date Due  . Hepatitis C Screening  Mar 12, 1950    Activities of Daily Living In your present state of health, do you have any difficulty performing the following activities: 04/30/2015 05/04/2014  Hearing? Tempie Donning  Vision? N N  Difficulty concentrating or making decisions? N Y  Walking or climbing stairs? N N  Dressing or bathing? N N  Doing errands, shopping? N N  Preparing Food and eating ? N -  Using the Toilet? N -  In the past six months, have you accidently leaked urine? N -  Do you have problems with loss of bowel control? N -  Managing your Medications? N -  Managing your Finances? N -  Housekeeping or managing your Housekeeping? N -    Physical Exam  (optional), or other factors deemed appropriate based on the  beneficiary's medical and social history and current clinical standards. BP 143/90 mmHg  Pulse 80  Temp(Src) 98.9 F (37.2 C) (Oral)  Ht 5' 6.5" (1.689 m)  Wt 211 lb 6.4 oz (95.89 kg)  BMI 33.61 kg/m2  SpO2 98% General appearance: alert, cooperative, appears stated age and no distress Head: Normocephalic, without obvious abnormality, atraumatic Eyes: conjunctivae/corneas clear. PERRL, EOM's intact. Fundi benign. Ears: normal TM's and external ear canals both ears Nose: Nares normal. Septum midline. Mucosa normal. No drainage or sinus tenderness. Throat: lips, mucosa, and tongue normal; teeth and gums normal Neck: no adenopathy, no carotid bruit, no JVD, supple, symmetrical, trachea midline and thyroid not enlarged, symmetric, no tenderness/mass/nodules Back: symmetric, no curvature. ROM normal. No CVA tenderness. Lungs: clear to auscultation bilaterally Breasts: normal appearance, no masses or tenderness Heart: regular rate and rhythm, S1, S2 normal, no  murmur, click, rub or gallop Abdomen: soft, non-tender; bowel sounds normal; no masses,  no organomegaly Pelvic: cervix normal in appearance, external genitalia normal, no adnexal masses or tenderness, no cervical motion tenderness, rectovaginal septum normal, uterus normal size, shape, and consistency, vagina normal without discharge and pap done,  rectal heme neg brown stool Extremities: extremities normal, atraumatic, no cyanosis or edema Pulses: 2+ and symmetric Skin: Skin color, texture, turgor normal. No rashes or lesions Lymph nodes: Cervical, supraclavicular, and axillary nodes normal. Neurologic: Alert and oriented X 3, normal strength and tone. Normal symmetric reflexes. Normal coordination and gait Advanced Directives: Does patient have an advance directive?: Yes Type of Advance Directive: Healthcare Power of Attorney, Living will Does patient want to make changes to advanced directive?: Yes - information given Copy of  advanced directive(s) in chart?: Yes    Assessment:    This is a routine wellness examination for this patient .  Vision/Hearing screen Hearing Screening Comments: Some hearing loss--- but not bad enough to do anything now Vision Screening Comments: opth  Dietary issues and exercise activities discussed:  Current Exercise Habits: Home exercise routine, Type of exercise: walking, Time (Minutes): > 60, Frequency (Times/Week): 3, Weekly Exercise (Minutes/Week): 0, Intensity: Mild, Exercise limited by: cardiac condition(s)  Goals    None     Depression Screen PHQ 2/9 Scores 04/30/2015 04/30/2015  PHQ - 2 Score 0 0     Fall Risk Fall Risk  04/30/2015  Falls in the past year? No    MMSE: MMSE - Mini Mental State Exam 04/30/2015  Orientation to time 5  Orientation to Place 5  Registration 3  Attention/ Calculation 5  Recall 3  Language- name 2 objects 2  Language- repeat 1  Language- follow 3 step command 3  Language- read & follow direction 1  Write a sentence 1  Copy design 1  Total score 30    Patient Care Team: Ann Held, DO as PCP - General (Family Medicine) Katy Apo, MD as Consulting Physician (Ophthalmology) Lavonna Monarch, MD as Consulting Physician (Dermatology)     Plan:     During the course of the visit the patient was educated and counseled about the following appropriate screening and preventive services:   Vaccines to include Pneumoccal, Influenza, Hepatitis B, Td, Zostavax, HCV  Electrocardiogram  Cardiovascular Disease  Colorectal cancer screening  Bone density screening  Diabetes screening  Glaucoma screening  Mammography/PAP  Nutrition counseling  Her current medications and allergies were reviewed and needed refills of her chronic medications were ordered. The plan for yearly health maintenance was discussed and all orders and referrals were made as appropriate.  Patient Instructions (the written plan) was given to the  patient.  1. Welcome to Medicare preventive visit See above - EKG 12-Lead  2. Hypothyroidism, unspecified hypothyroidism type  - thyroid (ARMOUR THYROID) 60 MG tablet; Take 1 tablet (60 mg total) by mouth daily.  Dispense: 90 tablet; Refill: 3 - POCT urinalysis dipstick - CBC w/Diff - Lipid panel - TSH - Comp Met (CMET)  3. Endometrial cancer (HCC)  - nystatin-triamcinolone ointment (MYCOLOG); Apply 1 application topically 2 (two) times daily.  Dispense: 30 g; Refill: 12 - Cytology - PAP  4. Rosacea  - metroNIDAZOLE (METROGEL) 0.75 % gel; Apply 1 application topically 2 (two) times daily.  Dispense: 45 g; Refill: 5  5. Depression  - FLUoxetine (PROZAC) 20 MG capsule; TAKE ONE CAPSULE BY MOUTH EVERY DAY  Dispense: 90 capsule; Refill: 3  6. Routine history and physical examination of adult  - CBC w/Diff - Lipid panel - TSH - Comp Met (CMET)  7. Blurred vision  - Ambulatory referral to Ophthalmology  8. Routine cervical smear - Cytology - PAP  9. Nonspecific abnormal electrocardiogram (ECG) (EKG)  - POCT urinalysis dipstick  10. Need for hepatitis C screening test  - Hepatitis C antibody    Ann Held, DO 05/01/2015    -

## 2015-05-01 LAB — COMPREHENSIVE METABOLIC PANEL
ALBUMIN: 4.2 g/dL (ref 3.6–5.1)
ALT: 29 U/L (ref 6–29)
AST: 22 U/L (ref 10–35)
Alkaline Phosphatase: 63 U/L (ref 33–130)
BUN: 14 mg/dL (ref 7–25)
CALCIUM: 9.5 mg/dL (ref 8.6–10.4)
CHLORIDE: 104 mmol/L (ref 98–110)
CO2: 22 mmol/L (ref 20–31)
Creat: 0.6 mg/dL (ref 0.50–0.99)
GLUCOSE: 95 mg/dL (ref 65–99)
Potassium: 4 mmol/L (ref 3.5–5.3)
Sodium: 140 mmol/L (ref 135–146)
Total Bilirubin: 0.4 mg/dL (ref 0.2–1.2)
Total Protein: 6.7 g/dL (ref 6.1–8.1)

## 2015-05-01 LAB — LIPID PANEL
CHOL/HDL RATIO: 5.5 ratio — AB (ref <=5.0)
CHOLESTEROL: 242 mg/dL — AB (ref 125–200)
HDL: 44 mg/dL — ABNORMAL LOW (ref >=46)
LDL CALC: 130 mg/dL — AB (ref <130)
TRIGLYCERIDES: 338 mg/dL — AB (ref <150)
VLDL: 68 mg/dL — AB (ref <30)

## 2015-05-01 LAB — HEPATITIS C ANTIBODY: HCV Ab: NEGATIVE

## 2015-05-01 LAB — TSH: TSH: 1.23 mIU/L

## 2015-05-05 DIAGNOSIS — H2513 Age-related nuclear cataract, bilateral: Secondary | ICD-10-CM | POA: Diagnosis not present

## 2015-05-05 DIAGNOSIS — H524 Presbyopia: Secondary | ICD-10-CM | POA: Diagnosis not present

## 2015-05-05 DIAGNOSIS — H43813 Vitreous degeneration, bilateral: Secondary | ICD-10-CM | POA: Diagnosis not present

## 2015-05-05 DIAGNOSIS — H52223 Regular astigmatism, bilateral: Secondary | ICD-10-CM | POA: Diagnosis not present

## 2015-05-05 DIAGNOSIS — H5213 Myopia, bilateral: Secondary | ICD-10-CM | POA: Diagnosis not present

## 2015-05-05 LAB — CYTOLOGY - PAP

## 2015-05-20 ENCOUNTER — Other Ambulatory Visit: Payer: Self-pay | Admitting: Family Medicine

## 2015-05-30 ENCOUNTER — Encounter: Payer: Self-pay | Admitting: Family Medicine

## 2015-05-31 NOTE — Telephone Encounter (Signed)
Needs ov

## 2015-06-02 ENCOUNTER — Encounter: Payer: Self-pay | Admitting: Family Medicine

## 2015-06-02 ENCOUNTER — Ambulatory Visit (INDEPENDENT_AMBULATORY_CARE_PROVIDER_SITE_OTHER): Payer: Medicare Other | Admitting: Family Medicine

## 2015-06-02 VITALS — BP 148/80 | HR 69 | Temp 98.1°F | Ht 66.0 in | Wt 207.0 lb

## 2015-06-02 DIAGNOSIS — J45991 Cough variant asthma: Secondary | ICD-10-CM

## 2015-06-02 DIAGNOSIS — J309 Allergic rhinitis, unspecified: Secondary | ICD-10-CM

## 2015-06-02 MED ORDER — BECLOMETHASONE DIPROPIONATE 80 MCG/ACT IN AERS: 1.0000 | INHALATION_SPRAY | Freq: Two times a day (BID) | RESPIRATORY_TRACT | Status: DC

## 2015-06-02 MED ORDER — PREDNISONE 20 MG PO TABS: ORAL_TABLET | ORAL | Status: DC

## 2015-06-02 NOTE — Progress Notes (Signed)
[place] at [facility] [address] [place], [place] [address] [phone]  Date:  06/02/2015   Name:  [name]   DOB:  1950/07/01   MRN:  [id number]  PCP:  Ann Held, DO    Chief Complaint: Cough   History of Present Illness:  [name] is a 65 y.o. very pleasant female patient who presents with the following:  History of allergies, hypothyroidism.  Here today with illness- she has noted sx for about 5 days.  Started with malaise, thought she was just suffering from asthma and allergies.  Then started to have coughing fits that could be severe.   Friday (today is Wednesday) she was pretty sick with severe cough.  She is significantly better now Her husband kept an eye on her because they were worried Friday night.   She does have an inhaler.  She has been using this and it does help for a little while She has not noted any chills or fever in particular.  She has felt kind of tired.   When she is well, she may notice sx of asthma less than once a week.  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  She did have radiation to her pelvis following uterine cancer in 2010- radiation after effects can cause diarrhea.  Last used her albuterol yesterday  She smoked 15 pack years total  Patient Active Problem List   Diagnosis Date Noted  . Rosacea 05/04/2014  . Obesity (BMI 30-39.9) 03/21/2013  . Elevated LFTs 03/06/2012  . Depression 03/22/2011  . Hypothyroidism 04/16/2009  . ALLERGIC RHINITIS 04/16/2009  . GERD 04/16/2009  . SLEEP DISORDER 04/16/2009  . Uterine cancer ([place]) 04/16/2009    Past Medical History  Diagnosis Date  . Allergic rhinitis   . GERD (gastroesophageal reflux disease)   . Hypothyroidism     Cytomel Rx'ed by Dr.Vaughan  . Depression    . History of uterine cancer   . Cancer ([place]) 04/2008    uterine  . Asthma     Past Surgical History  Procedure Laterality Date  . Total abdominal hysterectomy w/ bilateral salpingoophorectomy  2010  . Abdominal hysterectomy  04/2008    TAH BSO    Social History  Substance Use Topics  . Smoking status: Former Smoker -- 1.00 packs/day for 25 years    Types: Cigarettes    Quit date: 01/30/1993  . Smokeless tobacco: None  . Alcohol Use: 8.4 oz/week    14 Cans of beer per week    Family History  Problem Relation Age of Onset  . Alcohol abuse Father   . Alcohol abuse Mother   . Arthritis Mother   . Stroke Mother   . Diabetes Mother   . Cancer Paternal Grandfather     stomach    Allergies  Allergen Reactions  . Levofloxacin     REACTION: Rash (urticaria)    Medication list has been reviewed and updated.  Current Outpatient Prescriptions on File Prior to Visit  Medication Sig Dispense Refill  . albuterol (VENTOLIN HFA) 108 (90 Base) MCG/ACT inhaler INHALE 2 PUFFS EVERY 4 HOURS AS NEEDED FOR WHEEZING 18 each 1  . b complex vitamins tablet Take 1 tablet by mouth daily.    Marland Kitchen  Cholecalciferol (VITAMIN D-3 PO) Take 1 tablet by mouth daily.    . Coenzyme Q10 (COQ10) 200 MG CAPS Take by mouth. 1 by mouth 4-5 x weekly     . FLUoxetine (PROZAC) 20 MG capsule TAKE ONE CAPSULE BY MOUTH EVERY DAY 90 capsule 3  . GAMMA AMINOBUTYRIC ACID PO Take 500 mg by mouth daily.    Marland Kitchen loperamide (IMODIUM) 2 MG capsule Take 2 mg by mouth as needed.     . Melatonin 3 MG CAPS 2-3 po qhs  0  . metroNIDAZOLE (METROGEL) 0.75 % gel Apply 1 application topically 2 (two) times daily. 45 g 5  . MILK THISTLE PO Take 6 capsules by mouth daily. Reported on 06/02/2015    . nystatin-triamcinolone ointment (MYCOLOG) Apply 1 application topically 2 (two) times daily. 30 g 12  . Omega-3 Fatty Acids (FISH OIL PO) Take 2 capsules by mouth 2 (two) times daily. 2 by mouth three times daily    . OVER THE COUNTER  MEDICATION Take 2 capsules by mouth daily. Over the counter "Zyflamend" herbal joint health    . thyroid (ARMOUR THYROID) 60 MG tablet Take 1 tablet (60 mg total) by mouth daily. 90 tablet 3  . TURMERIC PO Take 2 tablets by mouth 2 (two) times daily.     . fluticasone (FLONASE) 50 MCG/ACT nasal spray Place 1 spray into the nose as directed. 1 spray in each nostril twice a day as needed. Use the "crossover" technique as discussed 16 g 2  . RESVERATROL PO Take 2 tablets by mouth daily. Reported on 06/02/2015     No current facility-administered medications on file prior to visit.    Review of Systems:  As per HPI- otherwise negative.   Physical Examination: Filed Vitals:   06/02/15 1258  BP: 148/80  Pulse: 69  Temp: 98.1 F (36.7 C)   Filed Vitals:   06/02/15 1258  Height: 5\' 6"  (1.676 m)  Weight: 207 lb (93.895 kg)   Body mass index is 33.43 kg/(m^2). Ideal Body Weight: Weight in (lb) to have BMI = 25: 154.6  GEN: WDWN, NAD, Non-toxic, A & O x 3, overweight, looks well HEENT: Atraumatic, Normocephalic. Neck supple. No masses, No LAD.  Bilateral TM wnl, oropharynx normal.  PEERL,EOMI.   Ears and Nose: No external deformity. CV: RRR, No M/G/R. No JVD. No thrill. No extra heart sounds. PULM: CTA B, no wheezes, crackles, rhonchi. No retractions. No resp. distress. No accessory muscle use. ABD: S, NT, ND EXTR: No c/c/e NEURO Normal gait.  PSYCH: Normally interactive. Conversant. Not depressed or anxious appearing.  Calm demeanor.    Assessment and Plan: Cough variant asthma - Plan: beclomethasone (QVAR) 80 MCG/ACT inhaler, predniSONE (DELTASONE) 20 MG tablet  Allergic rhinitis, unspecified allergic rhinitis type  Here today with illness- she is actually better than she was last week.  She has mild but persistent sx of asthma that can be more severe at times Will have her start an OTC antihistamine, and also qvar BID She will let me know if not getting better and can add a  short course of po prednisone if needed See patient instructions for more details.     Signed Lamar Blinks, MD

## 2015-06-02 NOTE — Progress Notes (Signed)
Pre visit review using our clinic tool,if applicable. No additional management support is needed unless otherwise documented below in the visit note.  

## 2015-06-02 NOTE — Patient Instructions (Signed)
Add a daily dose of claritin or zyrtect - this may help with your allergies Let's add Qvar inhaled steroid for a while- use this for a month or so, 1 puff twice a day regardless of symptoms. If this seems to lessen your asthma and allergy symptoms you can continue to use it long term  If your cough/ wheeze persists for several days more you can also fill and use the rx for oral prednisone.

## 2015-06-04 ENCOUNTER — Telehealth: Payer: Self-pay | Admitting: Family Medicine

## 2015-06-04 NOTE — Telephone Encounter (Signed)
Pt called with list that insurance gave alternatives of Flovent V diskus, Arnuity Ellipta, Flovent FF FFA - Please call pt and let her know if alternative med is sent in.3  Ph# [phone]

## 2015-06-04 NOTE — Telephone Encounter (Signed)
Relation to [id number] Call back [place]: [facility] ([facility]), [place] [facility]  Reason for call:  Patient would like to know the status of PA, patient informed PA process, please advise

## 2015-06-04 NOTE — Telephone Encounter (Signed)
Pt says that she was seen and prescribed Beclomethasone, which is requiring a PA. Explained process to pt . Pt expressed understanding.  Pt says that she will contact pharmacy to fax form.

## 2015-06-04 NOTE — Telephone Encounter (Signed)
Please advise      KP 

## 2015-06-05 NOTE — Telephone Encounter (Signed)
arnuity ellipta 1 inh qd  #1  5 refills

## 2015-06-07 MED ORDER — FLUTICASONE FUROATE 100 MCG/ACT IN AEPB
1.0000 | INHALATION_SPRAY | Freq: Every day | RESPIRATORY_TRACT | Status: DC
Start: 2015-06-07 — End: 2023-09-12

## 2015-06-07 NOTE — Telephone Encounter (Signed)
Received PA request, declined according to provider's note and new Rx to pharmacy/SLS 05/08

## 2016-03-28 ENCOUNTER — Telehealth: Payer: Self-pay | Admitting: Family Medicine

## 2016-03-28 NOTE — Telephone Encounter (Signed)
Called patient to schedule awv. Lvm for patient to call office to schedule appt.  °

## 2016-03-30 ENCOUNTER — Other Ambulatory Visit: Payer: Self-pay | Admitting: Family Medicine

## 2016-03-30 ENCOUNTER — Telehealth: Payer: Self-pay | Admitting: Family Medicine

## 2016-03-30 NOTE — Telephone Encounter (Signed)
Spoke with patient this morning in regards to awv. Patient would like a refill on her thyroid medication.

## 2016-03-30 NOTE — Telephone Encounter (Signed)
Spoke with patient regarding awv. Patient had appt scheduled for 05/02/2016 at 10 am, but decided to cx appt. Patient stated that Dr. Etter Sjogren covers everything for her. She stated that she just wants to get a refill on her thyroid medication. Cx appt for patient sent message to nurse in regards to medication refill.

## 2016-04-04 MED ORDER — THYROID 60 MG PO TABS: 60.0000 mg | ORAL_TABLET | Freq: Every day | ORAL | 0 refills | Status: DC

## 2016-04-04 NOTE — Telephone Encounter (Signed)
Sent in thyroid medication Informed to schedule appt.

## 2016-04-24 ENCOUNTER — Other Ambulatory Visit: Payer: Self-pay | Admitting: Family Medicine

## 2016-05-02 ENCOUNTER — Ambulatory Visit: Payer: Medicare Other | Admitting: *Deleted

## 2016-08-16 ENCOUNTER — Other Ambulatory Visit: Payer: Self-pay | Admitting: Family Medicine

## 2016-08-17 MED ORDER — THYROID 60 MG PO TABS: 60.0000 mg | ORAL_TABLET | Freq: Every day | ORAL | 0 refills | Status: DC

## 2016-08-28 DIAGNOSIS — N952 Postmenopausal atrophic vaginitis: Secondary | ICD-10-CM | POA: Diagnosis not present

## 2016-08-28 DIAGNOSIS — R4584 Anhedonia: Secondary | ICD-10-CM | POA: Diagnosis not present

## 2016-08-28 DIAGNOSIS — K520 Gastroenteritis and colitis due to radiation: Secondary | ICD-10-CM | POA: Diagnosis not present

## 2016-08-28 DIAGNOSIS — F4323 Adjustment disorder with mixed anxiety and depressed mood: Secondary | ICD-10-CM | POA: Diagnosis not present

## 2016-08-28 DIAGNOSIS — E039 Hypothyroidism, unspecified: Secondary | ICD-10-CM | POA: Diagnosis not present

## 2016-08-28 DIAGNOSIS — F341 Dysthymic disorder: Secondary | ICD-10-CM | POA: Diagnosis not present

## 2016-08-28 DIAGNOSIS — E032 Hypothyroidism due to medicaments and other exogenous substances: Secondary | ICD-10-CM | POA: Diagnosis not present

## 2016-08-28 DIAGNOSIS — N8502 Endometrial intraepithelial neoplasia [EIN]: Secondary | ICD-10-CM | POA: Diagnosis not present

## 2016-08-28 DIAGNOSIS — I00 Rheumatic fever without heart involvement: Secondary | ICD-10-CM | POA: Diagnosis not present

## 2016-09-04 DIAGNOSIS — Z6835 Body mass index (BMI) 35.0-35.9, adult: Secondary | ICD-10-CM | POA: Diagnosis not present

## 2016-09-04 DIAGNOSIS — B078 Other viral warts: Secondary | ICD-10-CM | POA: Diagnosis not present

## 2016-09-04 DIAGNOSIS — N905 Atrophy of vulva: Secondary | ICD-10-CM | POA: Diagnosis not present

## 2016-09-04 DIAGNOSIS — E039 Hypothyroidism, unspecified: Secondary | ICD-10-CM | POA: Diagnosis not present

## 2016-09-04 DIAGNOSIS — Z01419 Encounter for gynecological examination (general) (routine) without abnormal findings: Secondary | ICD-10-CM | POA: Diagnosis not present

## 2016-09-04 DIAGNOSIS — F341 Dysthymic disorder: Secondary | ICD-10-CM | POA: Diagnosis not present

## 2016-11-06 DIAGNOSIS — R030 Elevated blood-pressure reading, without diagnosis of hypertension: Secondary | ICD-10-CM | POA: Diagnosis not present

## 2016-11-06 DIAGNOSIS — N905 Atrophy of vulva: Secondary | ICD-10-CM | POA: Diagnosis not present

## 2016-11-06 DIAGNOSIS — Z6835 Body mass index (BMI) 35.0-35.9, adult: Secondary | ICD-10-CM | POA: Diagnosis not present

## 2016-11-06 DIAGNOSIS — F341 Dysthymic disorder: Secondary | ICD-10-CM | POA: Diagnosis not present

## 2016-12-26 DIAGNOSIS — E039 Hypothyroidism, unspecified: Secondary | ICD-10-CM | POA: Diagnosis not present

## 2016-12-26 DIAGNOSIS — I10 Essential (primary) hypertension: Secondary | ICD-10-CM | POA: Diagnosis not present

## 2016-12-26 DIAGNOSIS — Z6835 Body mass index (BMI) 35.0-35.9, adult: Secondary | ICD-10-CM | POA: Diagnosis not present

## 2016-12-26 DIAGNOSIS — F341 Dysthymic disorder: Secondary | ICD-10-CM | POA: Diagnosis not present

## 2017-08-09 DIAGNOSIS — E039 Hypothyroidism, unspecified: Secondary | ICD-10-CM | POA: Diagnosis not present

## 2017-08-09 DIAGNOSIS — E785 Hyperlipidemia, unspecified: Secondary | ICD-10-CM | POA: Diagnosis not present

## 2017-08-09 DIAGNOSIS — F39 Unspecified mood [affective] disorder: Secondary | ICD-10-CM | POA: Diagnosis not present

## 2017-08-09 DIAGNOSIS — I10 Essential (primary) hypertension: Secondary | ICD-10-CM | POA: Diagnosis not present

## 2017-08-09 DIAGNOSIS — Z8542 Personal history of malignant neoplasm of other parts of uterus: Secondary | ICD-10-CM | POA: Diagnosis not present

## 2018-05-06 ENCOUNTER — Telehealth: Payer: Self-pay | Admitting: Family Medicine

## 2018-05-06 NOTE — Telephone Encounter (Signed)
LVM for pt to schedule appt, pt has not been seen since 2017.

## 2018-07-22 ENCOUNTER — Encounter: Payer: Self-pay | Admitting: *Deleted

## 2021-01-11 ENCOUNTER — Other Ambulatory Visit: Payer: Self-pay

## 2021-01-11 ENCOUNTER — Other Ambulatory Visit: Payer: Self-pay | Admitting: Family Medicine

## 2021-01-11 ENCOUNTER — Ambulatory Visit
Admission: RE | Admit: 2021-01-11 | Discharge: 2021-01-11 | Disposition: A | Discharge status: Home / Self Care | Payer: Medicare Other | Source: Ambulatory Visit | Attending: Family Medicine | Admitting: Family Medicine

## 2021-01-11 DIAGNOSIS — R053 Chronic cough: Secondary | ICD-10-CM

## 2023-07-25 ENCOUNTER — Other Ambulatory Visit: Payer: Self-pay

## 2023-07-25 ENCOUNTER — Encounter (HOSPITAL_COMMUNITY): Payer: Self-pay

## 2023-07-25 ENCOUNTER — Emergency Department (HOSPITAL_COMMUNITY)

## 2023-07-25 ENCOUNTER — Emergency Department (HOSPITAL_COMMUNITY)
Admission: EM | Admit: 2023-07-25 | Discharge: 2023-07-26 | Disposition: A | Discharge status: Home / Self Care | Attending: Emergency Medicine | Admitting: Emergency Medicine

## 2023-07-25 DIAGNOSIS — I4891 Unspecified atrial fibrillation: Secondary | ICD-10-CM | POA: Insufficient documentation

## 2023-07-25 DIAGNOSIS — Z7901 Long term (current) use of anticoagulants: Secondary | ICD-10-CM | POA: Diagnosis not present

## 2023-07-25 DIAGNOSIS — R079 Chest pain, unspecified: Secondary | ICD-10-CM

## 2023-07-25 LAB — BASIC METABOLIC PANEL WITH GFR
Anion gap: 8 (ref 5–15)
BUN: 13 mg/dL (ref 8–23)
CO2: 25 mmol/L (ref 22–32)
Calcium: 9.5 mg/dL (ref 8.9–10.3)
Chloride: 104 mmol/L (ref 98–111)
Creatinine, Ser: 0.78 mg/dL (ref 0.44–1.00)
GFR, Estimated: >60 mL/min (ref >60)
Glucose, Bld: 106 mg/dL — ABNORMAL HIGH (ref 70–99)
Potassium: 4.4 mmol/L (ref 3.5–5.1)
Sodium: 137 mmol/L (ref 135–145)

## 2023-07-25 LAB — CBC
HCT: 43.5 % (ref 36.0–46.0)
Hemoglobin: 14.6 g/dL (ref 12.0–15.0)
MCH: 33 pg (ref 26.0–34.0)
MCHC: 33.6 g/dL (ref 30.0–36.0)
MCV: 98.2 fL (ref 80.0–100.0)
Platelets: 263 10*3/uL (ref 150–400)
RBC: 4.43 MIL/uL (ref 3.87–5.11)
RDW: 13 % (ref 11.5–15.5)
WBC: 9.4 10*3/uL (ref 4.0–10.5)
nRBC: 0 % (ref 0.0–0.2)

## 2023-07-25 LAB — TROPONIN I (HIGH SENSITIVITY): Troponin I (High Sensitivity): 3 ng/L (ref <18)

## 2023-07-25 NOTE — ED Triage Notes (Signed)
 Patient BIB [facility] from home for substernal chest pain that lasted about 45 mins and resolved prior to EMS arrival. Patient took 324mg  aspirin before EMS arrived as well.  Patient wants to get checked out.  BP 130/70 HR 70-80 a fib, hx of same 96% RA RR 18

## 2023-07-25 NOTE — ED Provider Notes (Incomplete)
 [facility]PT [facility] Emergency Department Provider Note MRN:  [id number]  Arrival date & time: 07/26/23     Chief Complaint   Chest Pain   History of Present Illness   [name] is a 73 y.o. year-old female presents to the ED with chief complaint of chest pain that started this afternoon between 2 and 4.  She states that the pain was in the epigastrium.  She has some associated nausea and almost vomited.  She states that the pain radiated into her back.  She she states that the symptoms last about an hour and resolved.  She remains symptom free now.  Took 324mg  of aspirin PTA.  Has hx of afib.   History provided by patient.   Review of Systems  Pertinent positive and negative review of systems noted in HPI.    Physical Exam   Vitals:   07/25/23 1951 07/25/23 2349  BP: (!) 144/82 132/81  Pulse: 94 81  Resp: 18 16  Temp: 98.2 F (36.8 C) 97.7 F (36.5 C)  SpO2: 95% 99%    CONSTITUTIONAL:  non toxic-appearing, NAD NEURO:  Alert and oriented x 3, CN 3-12 grossly intact EYES:  eyes equal and reactive ENT/NECK:  Supple, no stridor  CARDIO:  normal rate, regular rhythm, appears well-perfused  PULM:  No respiratory distress, CTAB GI/GU:  non-distended,  MSK/SPINE:  No gross deformities, no edema, moves all extremities  SKIN:  no rash, atraumatic   *Additional and/or pertinent findings included in MDM below  Diagnostic and Interventional Summary    EKG Interpretation Date/Time:    Ventricular Rate:    PR Interval:    QRS Duration:    QT Interval:    QTC Calculation:   R Axis:      Text Interpretation:         Labs Reviewed  BASIC METABOLIC PANEL WITH GFR - Abnormal; Notable for the following components:      Result Value   Glucose, Bld 106 (*)    All other components within normal limits  CBC  TROPONIN I (HIGH SENSITIVITY)  TROPONIN I (HIGH SENSITIVITY)    DG Chest 2 View  Final Result      Medications - No data to display    Procedures  /  Critical Care Procedures  ED Course and Medical Decision Making  I have reviewed the triage vital signs, the nursing notes, and pertinent available records from the EMR.  Social Determinants Affecting Complexity of Care: Patient has no clinically significant social determinants affecting this chief complaint..   ED Course: Clinical Course as of 07/26/23 0056  Wed Jul 25, 2023  2346 EKG with atrial fibrillation, no prior history of this.  [SG]  Thu Jul 26, 2023  0056 Troponin I (High Sensitivity) Troponins are negative, doubt ACS [RB]  0056 Basic metabolic panel(!) No significant electrolyte abnormality [RB]  0056 CBC No leukocytosis or anemia [RB]    Clinical Course User Index [RB] Kerry Charleston, PA-C [SG] Kerry Jayson DELENA, DO    Medical Decision Making Patient here with chest pain that started this afternoon around 2 PM.  The symptoms lasted for about an hour.  She had some associated nausea and some pain in her throat.  She denies having had shortness of breath.  She states that the symptoms have resolved completely now.  She does tell me that she has been diagnosed with A-fib in the past.  Her EKG is A-fib today.  She is currently rate controlled.  She is not anticoagulated.  She states that she does not want to be on an anticoagulant and prefers to do naturopathic/homeopathic treatments.  She states that she is not opposed to taking a baby aspirin daily.  I have recommended that she follow-up in the A-fib clinic.  Her CHA2DS2-VASc is 2.  She is requesting discharge, and I feel that this is appropriate at this time.  We discussed return precautions.  Amount and/or Complexity of Data Reviewed Labs: ordered. Decision-making details documented in ED Course. Radiology: ordered. ECG/medicine tests: ordered.  Risk OTC drugs.         Consultants: No consultations were needed in caring for this patient.   Treatment and Plan: I considered admission due  to patient's initial presentation, but after considering the examination and diagnostic results, patient will not require admission and can be discharged with outpatient follow-up.    Final Clinical Impressions(s) / ED Diagnoses     ICD-10-CM   1. Atrial fibrillation, unspecified type (HCC)  I48.91     2. Chest pain, unspecified type  R07.9       ED Discharge Orders          Ordered    Amb referral to AFIB Clinic        07/26/23 0052    aspirin 81 MG chewable tablet  Daily        07/26/23 0052              Discharge Instructions Discussed with and Provided to Patient:     Discharge Instructions      Your blood work looks good. Your heart markers are normal.  Your EKG shows atrial fibrillation, and irregular heart rhythm.  I recommend that you follow-up in the a-fib clinic.       Kerry Charleston, PA-C 07/26/23 0056    Kerry Charleston, PA-C 07/26/23 9940    Raford Lenis, MD 07/26/23 [phone]

## 2023-07-26 LAB — TROPONIN I (HIGH SENSITIVITY): Troponin I (High Sensitivity): 3 ng/L (ref <18)

## 2023-07-26 MED ORDER — ASPIRIN 81 MG PO CHEW: 81.0000 mg | CHEWABLE_TABLET | Freq: Every day | ORAL | 0 refills | Status: DC

## 2023-07-26 NOTE — Discharge Instructions (Signed)
 Your blood work looks good. Your heart markers are normal.  Your EKG shows atrial fibrillation, and irregular heart rhythm.  I recommend that you follow-up in the a-fib clinic.

## 2023-08-02 ENCOUNTER — Ambulatory Visit (HOSPITAL_COMMUNITY)
Admission: RE | Admit: 2023-08-02 | Discharge: 2023-08-02 | Disposition: A | Discharge status: Home / Self Care | Source: Ambulatory Visit | Attending: Physician Assistant | Admitting: Physician Assistant

## 2023-08-02 ENCOUNTER — Encounter (HOSPITAL_COMMUNITY): Payer: Self-pay | Admitting: Physician Assistant

## 2023-08-02 VITALS — BP 120/72 | HR 85 | Ht 66.0 in | Wt 171.4 lb

## 2023-08-02 DIAGNOSIS — D6869 Other thrombophilia: Secondary | ICD-10-CM

## 2023-08-02 DIAGNOSIS — I4819 Other persistent atrial fibrillation: Secondary | ICD-10-CM | POA: Diagnosis not present

## 2023-08-02 DIAGNOSIS — I4891 Unspecified atrial fibrillation: Secondary | ICD-10-CM

## 2023-08-02 MED ORDER — APIXABAN 5 MG PO TABS: 5.0000 mg | ORAL_TABLET | Freq: Two times a day (BID) | ORAL | 3 refills | Status: DC

## 2023-08-02 NOTE — Addendum Note (Signed)
 Encounter addended by: Mathew Laymon SAILOR, CMA on: 08/02/2023 11:04 AM  Actions taken: Order list changed, Diagnosis association updated

## 2023-08-02 NOTE — Patient Instructions (Signed)
 Stop aspirin   Start Eliquis 5mg  twice a day   Echocardiogram -- scheduling will call once insurance authorization received.

## 2023-08-02 NOTE — Progress Notes (Signed)
 Primary Care Physician: Burney Darice CROME, MD Primary Cardiologist: None Electrophysiologist: None  Referring Physician: ED   [name] is a 73 y.o. female with a history of uterine cancer, hypothyroidism, HTN, atrial fibrillation who presents for consultation in the [facility].  The patient presented to the ED 07/25/23 with symptoms of epigastric pain and nausea. The symptoms lasted about one hour and then resolved. She was noted to be in afib with controlled rates. Patient stated she had previously been diagnosed with afib in 2023 by her PCP. Patient is unsure if she has been persistent since that time as she is asymptomatic. Patient is not currently on anticoagulation stroke prevention.    Patient presents today for follow up for atrial fibrillation. She is unaware of her arrhythmia and feels well today. She does consume alcohol regularly. No snoring or history of apnea.   Today, she denies symptoms of palpitations, chest pain, shortness of breath, orthopnea, PND, lower extremity edema, dizziness, presyncope, syncope, snoring, daytime somnolence, bleeding, or neurologic sequela. The patient is tolerating medications without difficulties and is otherwise without complaint today.    Atrial Fibrillation Risk Factors:  she does not have symptoms or diagnosis of sleep apnea. she does not have a history of rheumatic fever. she does have a history of alcohol use.   Atrial Fibrillation Management history:  Previous antiarrhythmic drugs: none Previous cardioversions: none Previous ablations: none Anticoagulation history: none  ROS- All systems are reviewed and negative except as per the HPI above.  Past Medical History:  Diagnosis Date   Allergic rhinitis    Asthma    Atypical nevus 10/09/2003   left ant thigh-mod (EXC)   Atypical nevus 10/04/2005   left calf-moderate   Cancer (HCC) 04/2008   uterine   Depression    GERD (gastroesophageal  reflux disease)    History of uterine cancer    Hypothyroidism    Cytomel Rx'ed by Dr.Vaughan    Current Outpatient Medications  Medication Sig Dispense Refill   albuterol  (VENTOLIN  HFA) 108 (90 Base) MCG/ACT inhaler INHALE 2 PUFFS EVERY 4 HOURS AS NEEDED FOR WHEEZING 18 each 1   apixaban (ELIQUIS) 5 MG TABS tablet Take 1 tablet (5 mg total) by mouth 2 (two) times daily. 60 tablet 3   b complex vitamins tablet Take 1 tablet by mouth daily.     buPROPion (WELLBUTRIN XL) 300 MG 24 hr tablet Take 300 mg by mouth every morning.     carvedilol (COREG) 6.25 MG tablet Take 6.25 mg by mouth 2 (two) times daily.     Cholecalciferol  (VITAMIN D -3 PO) Take 1 tablet by mouth daily.     Coenzyme Q10 (COQ10) 200 MG CAPS Take by mouth. 1 by mouth 4-5 x weekly      FLUoxetine  (PROZAC ) 20 MG capsule TAKE ONE CAPSULE BY MOUTH EVERY DAY 90 capsule 3   fluticasone  (FLONASE ) 50 MCG/ACT nasal spray Place 1 spray into the nose as directed. 1 spray in each nostril twice a day as needed. Use the crossover technique as discussed 16 g 2   Fluticasone  Furoate (ARNUITY ELLIPTA ) 100 MCG/ACT AEPB Inhale 1 puff into the lungs daily. 30 each 5   GAMMA AMINOBUTYRIC ACID PO Take 500 mg by mouth daily.     loperamide (IMODIUM) 2 MG capsule Take 2 mg by mouth as needed.      LORazepam (ATIVAN) 0.5 MG tablet Take 0.25-0.5 mg by mouth 2 (two) times daily as needed.  Melatonin 3 MG CAPS 2-3 po qhs  0   metroNIDAZOLE  (METROGEL ) 0.75 % gel Apply 1 application topically 2 (two) times daily. 45 g 5   MILK THISTLE PO Take 6 capsules by mouth daily. Reported on 06/02/2015     nystatin -triamcinolone  ointment (MYCOLOG) Apply 1 application topically 2 (two) times daily. 30 g 12   Omega-3 Fatty Acids (FISH OIL PO) Take 2 capsules by mouth 2 (two) times daily. 2 by mouth three times daily     OVER THE COUNTER MEDICATION Take 2 capsules by mouth daily. Over the counter Zyflamend herbal joint health     RESVERATROL PO Take 2 tablets by  mouth daily. Reported on 06/02/2015     TURMERIC PO Take 2 tablets by mouth 2 (two) times daily.      predniSONE  (DELTASONE ) 20 MG tablet Take 2 pills a day for 3 days, then 1 pill a day for 3 days if needed 9 tablet 0   thyroid  (ARMOUR THYROID ) 60 MG tablet Take 1 tablet (60 mg total) by mouth daily. 90 tablet 0   No current facility-administered medications for this encounter.    Physical Exam: BP 120/72   Pulse 85   Ht 5' 6 (1.676 m)   Wt 77.7 kg   BMI 27.66 kg/m   GEN: Well nourished, well developed in no acute distress NECK: No JVD CARDIAC: Irregularly irregular rate and rhythm, no murmurs, rubs, gallops RESPIRATORY:  Clear to auscultation without rales, wheezing or rhonchi  ABDOMEN: Soft, non-tender, non-distended EXTREMITIES:  No edema; No deformity   Wt Readings from Last 3 Encounters:  08/02/23 77.7 kg  06/02/15 93.9 kg  04/30/15 95.9 kg     EKG today demonstrates  Afib Vent. rate 85 BPM PR interval * ms QRS duration 94 ms QT/QTcB 364/433 ms    CHA2DS2-VASc Score = 3  The patient's score is based upon: CHF History: 0 HTN History: 1 Diabetes History: 0 Stroke History: 0 Vascular Disease History: 0 Age Score: 1 Gender Score: 1       ASSESSMENT AND PLAN: Persistent Atrial Fibrillation (ICD10:  I48.19) The patient's CHA2DS2-VASc score is 3, indicating a 3.2% annual risk of stroke.   Patient in rate controlled afib today. First diagnosed 2023, possibly longstanding, asymptomatic.  General education about afib provided and questions answered. We also discussed her stroke risk and the risks and benefits of anticoagulation. Start Eliquis 5 mg BID, stop ASA Check echocardiogram Continue carvedilol 6.25 mg BID We discussed rate vs rhythm control today. She is hesitant to undergo DCCV or start more prescription medications. Will revisit at follow up.   Secondary Hypercoagulable State (ICD10:  D68.69) The patient is at significant risk for  stroke/thromboembolism based upon her CHA2DS2-VASc Score of 3.  Start Apixaban (Eliquis).   HTN Stable on current regimen   Follow up in the AF clinic in one month.        [facility] [address] [place], [place] [address] [phone]

## 2023-08-07 ENCOUNTER — Ambulatory Visit (HOSPITAL_COMMUNITY): Payer: Self-pay | Admitting: Physician Assistant

## 2023-08-07 ENCOUNTER — Ambulatory Visit (HOSPITAL_COMMUNITY)
Admission: RE | Admit: 2023-08-07 | Discharge: 2023-08-07 | Disposition: A | Discharge status: Home / Self Care | Source: Ambulatory Visit | Attending: Physician Assistant | Admitting: Physician Assistant

## 2023-08-07 DIAGNOSIS — I083 Combined rheumatic disorders of mitral, aortic and tricuspid valves: Secondary | ICD-10-CM | POA: Insufficient documentation

## 2023-08-07 DIAGNOSIS — I4819 Other persistent atrial fibrillation: Secondary | ICD-10-CM

## 2023-08-07 DIAGNOSIS — I4891 Unspecified atrial fibrillation: Secondary | ICD-10-CM | POA: Diagnosis present

## 2023-08-07 DIAGNOSIS — D6869 Other thrombophilia: Secondary | ICD-10-CM | POA: Insufficient documentation

## 2023-08-07 LAB — ECHOCARDIOGRAM COMPLETE
AR max vel: 2.07 cm2
AV Area VTI: 1.2 cm2
AV Area mean vel: 1.22 cm2
AV Mean grad: 4 mmHg
AV Peak grad: 3.1 mmHg
Ao pk vel: 0.88 m/s
Area-P 1/2: 2.48 cm2
Calc EF: 47.5 %
MV VTI: 0.84 cm2
S' Lateral: 4.6 cm
Single Plane A2C EF: 43.4 %
Single Plane A4C EF: 49.1 %

## 2023-08-09 ENCOUNTER — Other Ambulatory Visit (HOSPITAL_COMMUNITY)

## 2023-08-10 ENCOUNTER — Other Ambulatory Visit (HOSPITAL_COMMUNITY): Payer: Self-pay | Admitting: *Deleted

## 2023-08-10 DIAGNOSIS — I340 Nonrheumatic mitral (valve) insufficiency: Secondary | ICD-10-CM

## 2023-08-17 ENCOUNTER — Ambulatory Visit: Attending: Nurse Practitioner | Admitting: Nurse Practitioner

## 2023-08-17 ENCOUNTER — Encounter (HOSPITAL_COMMUNITY): Payer: Self-pay | Admitting: *Deleted

## 2023-08-17 ENCOUNTER — Encounter: Payer: Self-pay | Admitting: Nurse Practitioner

## 2023-08-17 VITALS — BP 108/72 | HR 92 | Ht 65.0 in | Wt 170.2 lb

## 2023-08-17 DIAGNOSIS — R072 Precordial pain: Secondary | ICD-10-CM

## 2023-08-17 DIAGNOSIS — I340 Nonrheumatic mitral (valve) insufficiency: Secondary | ICD-10-CM

## 2023-08-17 DIAGNOSIS — R0609 Other forms of dyspnea: Secondary | ICD-10-CM

## 2023-08-17 DIAGNOSIS — I10 Essential (primary) hypertension: Secondary | ICD-10-CM

## 2023-08-17 DIAGNOSIS — I429 Cardiomyopathy, unspecified: Secondary | ICD-10-CM | POA: Diagnosis not present

## 2023-08-17 DIAGNOSIS — E039 Hypothyroidism, unspecified: Secondary | ICD-10-CM

## 2023-08-17 DIAGNOSIS — I4819 Other persistent atrial fibrillation: Secondary | ICD-10-CM

## 2023-08-17 NOTE — Patient Instructions (Addendum)
 Lab Work:  TODAY CBC, CMET,TSH,MAGNESIUM If you have labs (blood work) drawn today and your tests are completely normal, you will receive your results only by: MyChart Message (if you have MyChart) OR A paper copy in the mail If you have any lab test that is abnormal or we need to change your treatment, we will call you to review the results.  Testing/Procedures: Your physician has requested that you have a lexiscan myoview. For further information please visit [email or web address] Please follow instruction sheet, as given.   Follow-Up:   Your next appointment:   6-8 week(s)  Provider:   Damien Braver, NP          We recommend signing up for the patient portal called MyChart.  Sign up information is provided on this After Visit Summary.  MyChart is used to connect with patients for Virtual Visits (Telemedicine).  Patients are able to view lab/test results, encounter notes, upcoming appointments, etc.  Non-urgent messages can be sent to your provider as well.   To learn more about what you can do with MyChart, go to [email or web address].   Other Instructions How to Prepare for Your Myoview Test (stress test):  1.Please do not take these medications before your test:  (please note if this is an exercise test pt should hold beta blocker prior) 2.Your remaining medications may be taken with water. 3.Nothing to eat or drink, except water, 4 hours prior to arrival time.  NO caffeine/decaffeinated products, or chocolate 12 hours prior to arrival. 4.Ladies, please do not wear dresses.  Skirts or pants are approprate, please wear a short sleeve shirt. 5.NO perfume, cologne or lotion 6.Wear comfortable walking shoes.  NO HEELS! 7.Total time is 3 to 4 hours; you may want to bring reading material for the waiting time. 8.Please report to [facility] for your test  What to expect after you arrive:  Once you arrive and check in for your appointment an IV will be started in your arm.   Then the Technoligist will inject a small amount of radioactive tracer.  There will be a 1 hour waiting period after this injection.  A series of pictures will be taken of your heart following this waiting period.  You will be prepped for the stress portion of the test.  During the stress portion of your test you will either walk on a treadmill or receive a small, safe amount of radioactive tracer injected in your IV.  After the stress portion, there is a short rest period during which time your heart and blood pressure will be monitored.  After the short rest period the Technologist will begin your second set of pictures.  Your doctor will inform you of your test results within 7-10 business days.  In preparation for your appointment, medication and supplies will be purchased.  Appointment availability is limited, so if you need to cancel or reschedule please call the office at [phone] 24 hours in advance to avoid a cancellation fee of $100.00  IF YOU THINK YOU MAY BE PREGNANT, PLEASE INFORM THE TECHNOLOGIST.

## 2023-08-17 NOTE — Progress Notes (Signed)
 Office Visit    Patient Name: Kerry VINCIGUERRA Date of Encounter: 08/17/2023  Primary Care Provider:  Burney Darice CROME, MD Primary Cardiologist:  Gordy Bergamo, MD  Chief Complaint    73 year old female with a history of persistent atrial fibrillation, cardiomyopathy, mitral valve regurgitation, tricuspid valve regurgitation, hypertension, hypothyroidism, uterine cancer, GERD, COPD and prior tobacco use who presents for [facility] new patient evaluation in the setting of atrial fibrillation and mitral valve regurgitation.  Past Medical History    Past Medical History:  Diagnosis Date   Allergic rhinitis    Asthma    Atypical nevus 10/09/2003   left ant thigh-mod (EXC)   Atypical nevus 10/04/2005   left calf-moderate   Cancer (HCC) 04/2008   uterine   Depression    GERD (gastroesophageal reflux disease)    History of uterine cancer    Hypothyroidism    Cytomel Rx'ed by Dr.Vaughan   Past Surgical History:  Procedure Laterality Date   ABDOMINAL HYSTERECTOMY  04/2008   TAH BSO   TOTAL ABDOMINAL HYSTERECTOMY W/ BILATERAL SALPINGOOPHORECTOMY  2010    Allergies  Allergies  Allergen Reactions   Levofloxacin     REACTION: Rash (urticaria)     Labs/Other Studies Reviewed    The following studies were reviewed today:  Cardiac Studies & Procedures   ______________________________________________________________________________________________     ECHOCARDIOGRAM  ECHOCARDIOGRAM COMPLETE 08/07/2023  Narrative ECHOCARDIOGRAM REPORT    Patient Name:   [name] Date of Exam: 08/07/2023 Medical Rec #:  [id number]          Height:       66.0 in Accession #:    [id number]         Weight:       171.4 lb Date of Birth:  [birth year]          BSA:          1.873 m Patient Age:    73 years           BP:           120/72 mmHg Patient Gender: F                  HR:           81 bpm. Exam Location:  Outpatient  Procedure: 2D Echo, Cardiac Doppler and Color  Doppler (Both Spectral and Color Flow Doppler were utilized during procedure).  Indications:    Atrial Fibrillation I48.91  History:        Patient has no prior history of Echocardiogram examinations. Arrythmias:Atrial Fibrillation.  Sonographer:    Jayson Gaskins Referring Phys: [id number] CLINT R FENTON  IMPRESSIONS   1. Left ventricular ejection fraction, by estimation, is 45 to 50%. The left ventricle has mildly decreased function. The left ventricle demonstrates global hypokinesis. The left ventricular internal cavity size was mildly to moderately dilated. Left ventricular diastolic function could not be evaluated. 2. Suspect severe MR given pulmonary vein reversal. Posteriorly directed jet. Would recommend TEE for further characterization . The mitral valve is degenerative. Moderate to severe mitral valve regurgitation. No evidence of mitral stenosis. 3. Right ventricular systolic function is low normal. The right ventricular size is normal. There is normal pulmonary artery systolic pressure. The estimated right ventricular systolic pressure is 35.5 mmHg. 4. Left atrial size was severely dilated. 5. Tricuspid valve regurgitation is mild to moderate. 6. The aortic valve is normal in structure. There is mild thickening of the aortic valve. Aortic  valve regurgitation is not visualized. No aortic stenosis is present. 7. The inferior vena cava is normal in size with greater than 50% respiratory variability, suggesting right atrial pressure of 3 mmHg.  FINDINGS Left Ventricle: Left ventricular ejection fraction, by estimation, is 45 to 50%. The left ventricle has mildly decreased function. The left ventricle demonstrates global hypokinesis. The left ventricular internal cavity size was mildly to moderately dilated. There is no left ventricular hypertrophy. Left ventricular diastolic function could not be evaluated due to atrial fibrillation. Left ventricular diastolic function could not be  evaluated.  Right Ventricle: The right ventricular size is normal. No increase in right ventricular wall thickness. Right ventricular systolic function is low normal. There is normal pulmonary artery systolic pressure. The tricuspid regurgitant velocity is 2.85 m/s, and with an assumed right atrial pressure of 3 mmHg, the estimated right ventricular systolic pressure is 35.5 mmHg.  Left Atrium: Left atrial size was severely dilated.  Right Atrium: Right atrial size was normal in size.  Pericardium: There is no evidence of pericardial effusion.  Mitral Valve: Suspect severe MR given pulmonary vein reversal. Posteriorly directed jet. Would recommend TEE for further characterization. The mitral valve is degenerative in appearance. Moderate to severe mitral valve regurgitation. No evidence of mitral valve stenosis. MV peak gradient, 11.6 mmHg. The mean mitral valve gradient is 4.0 mmHg.  Tricuspid Valve: The tricuspid valve is normal in structure. Tricuspid valve regurgitation is mild to moderate. No evidence of tricuspid stenosis.  Aortic Valve: The aortic valve is normal in structure. There is mild thickening of the aortic valve. Aortic valve regurgitation is not visualized. No aortic stenosis is present. Aortic valve mean gradient measures 4.0 mmHg. Aortic valve peak gradient measures 3.1 mmHg. Aortic valve area, by VTI measures 1.20 cm.  Pulmonic Valve: The pulmonic valve was normal in structure. Pulmonic valve regurgitation is trivial. No evidence of pulmonic stenosis.  Aorta: The aortic root is normal in size and structure.  Venous: The inferior vena cava is normal in size with greater than 50% respiratory variability, suggesting right atrial pressure of 3 mmHg.  IAS/Shunts: No atrial level shunt detected by color flow Doppler.   LEFT VENTRICLE PLAX 2D LVIDd:         6.10 cm      Diastology LVIDs:         4.60 cm      LV e' medial:    10.20 cm/s LV PW:         1.30 cm      LV E/e'  medial:  17.6 LV IVS:        1.30 cm      LV e' lateral:   14.00 cm/s LVOT diam:     1.60 cm      LV E/e' lateral: 12.9 LV SV:         33 LV SV Index:   17 LVOT Area:     2.01 cm  LV Volumes (MOD) LV vol d, MOD A2C: 97.6 ml LV vol d, MOD A4C: 149.0 ml LV vol s, MOD A2C: 55.2 ml LV vol s, MOD A4C: 75.8 ml LV SV MOD A2C:     42.4 ml LV SV MOD A4C:     149.0 ml LV SV MOD BP:      59.2 ml  RIGHT VENTRICLE            IVC RV S prime:     9.36 cm/s  IVC diam: 1.80 cm TAPSE (M-mode): 2.0  cm  LEFT ATRIUM              Index        RIGHT ATRIUM           Index LA diam:        5.10 cm  2.72 cm/m   RA Area:     20.80 cm LA Vol (A2C):   131.0 ml 69.95 ml/m  RA Volume:   53.60 ml  28.62 ml/m LA Vol (A4C):   122.0 ml 65.14 ml/m LA Biplane Vol: 126.0 ml 67.28 ml/m AORTIC VALVE AV Area (Vmax):    2.07 cm AV Area (Vmean):   1.22 cm AV Area (VTI):     1.20 cm AV Vmax:           87.50 cm/s AV Vmean:          98.400 cm/s AV VTI:            0.274 m AV Peak Grad:      3.1 mmHg AV Mean Grad:      4.0 mmHg LVOT Vmax:         90.20 cm/s LVOT Vmean:        59.600 cm/s LVOT VTI:          0.163 m LVOT/AV VTI ratio: 0.59  AORTA Ao Root diam: 3.00 cm  MITRAL VALVE                TRICUSPID VALVE MV Area (PHT): 2.48 cm     TR Peak grad:   32.5 mmHg MV Area VTI:   0.84 cm     TR Vmax:        285.00 cm/s MV Peak grad:  11.6 mmHg MV Mean grad:  4.0 mmHg     SHUNTS MV Vmax:       1.70 m/s     Systemic VTI:  0.16 m MV Vmean:      92.1 cm/s    Systemic Diam: 1.60 cm MV Decel Time: 306 msec MV E velocity: 180.00 cm/s  Morene Brownie Electronically signed by Morene Brownie Signature Date/Time: 08/07/2023/1:56:33 PM    Final          ______________________________________________________________________________________________     Recent Labs: 08/17/2023: ALT 9; BUN 15; Creatinine, Ser 0.82; Hemoglobin 14.7; Magnesium 2.3; Platelets 331; Potassium 4.7; Sodium 139; TSH 2.730   Recent Lipid Panel    Component Value Date/Time   CHOL 242 (H) 04/30/2015 1514   TRIG 338 (H) 04/30/2015 1514   HDL 44 (L) 04/30/2015 1514   CHOLHDL 5.5 (H) 04/30/2015 1514   VLDL 68 (H) 04/30/2015 1514   LDLCALC 130 (H) 04/30/2015 1514   LDLDIRECT 141.5 03/21/2013 0904    History of Present Illness    73 year old female with the above past medical history including persistent atrial fibrillation, cardiomyopathy, mitral valve regurgitation, tricuspid valve regurgitation, hypertension, hypothyroidism, uterine cancer, GERD, COPD, and prior tobacco use.   She has a prior history of atrial fibrillation, initially diagnosed by PCP in 2023.  She has been essentially A-fib unaware, previously declined anticoagulation.  History of prior tobacco use, she smoked for almost 30 years.  Her mother has a history of stroke, her father died of cirrhosis in the setting of EtOH use and acetaminophen.  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 She drinks alcohol most days of the week, 1-2 drinks per day.  She drinks 1 cup of coffee  daily.  She also reports some symptoms of depression. She presented to the ED on 07/25/2023 in the setting of chest pain. EKG showed rate controlled atrial fibrillation. Troponin negative x 2.  She declined anticoagulation.  She was referred to the A-fib clinic.  She was seen on 08/02/2023 by Quita Kicks, PA-C.  She was started on Eliquis .  However, she declined DCCV.  Follow-up was recommended in 1 month.  Echocardiogram in 07/2023 showed EF 45 to 50%, mildly decreased LV function, LV global hypokinesis, degenerative mitral valve with moderate to severe mitral valve regurgitation, posteriorly directed jet, low normal RV systolic function, severely dilated left atrium, mild to moderate tricuspid valve regurgitation.   She presents today for follow-up. Since her recent visit with the A-fib clinic She has been stable from a cardiac  standpoint.  She shares that prior to her ED visit she had an episode of chest pressure that radiated to back, lasted approximately 1 hour, and resolved spontaneously. She has chronic mild dyspnea on exertion in the setting of COPD. Approximately 3 months ago she had an episode of dizziness, which lasted approximately 4 days.  She states she felt drunk at the time. She denies any further chest pain, denies palpitations, dizziness, presyncope or syncope.  Home Medications    Current Outpatient Medications  Medication Sig Dispense Refill   albuterol  (VENTOLIN  HFA) 108 (90 Base) MCG/ACT inhaler INHALE 2 PUFFS EVERY 4 HOURS AS NEEDED FOR WHEEZING 18 each 1   apixaban  (ELIQUIS ) 5 MG TABS tablet Take 1 tablet (5 mg total) by mouth 2 (two) times daily. 60 tablet 3   b complex vitamins tablet Take 1 tablet by mouth daily.     buPROPion (WELLBUTRIN XL) 300 MG 24 hr tablet Take 300 mg by mouth every morning.     carvedilol (COREG) 6.25 MG tablet Take 6.25 mg by mouth 2 (two) times daily.     Cholecalciferol  (VITAMIN D -3 PO) Take 1 tablet by mouth daily.     Coenzyme Q10 (COQ10) 200 MG CAPS Take by mouth. 1 by mouth 4-5 x weekly      FLUoxetine  (PROZAC ) 20 MG capsule TAKE ONE CAPSULE BY MOUTH EVERY DAY 90 capsule 3   fluticasone  (FLONASE ) 50 MCG/ACT nasal spray Place 1 spray into the nose as directed. 1 spray in each nostril twice a day as needed. Use the crossover technique as discussed 16 g 2   Fluticasone  Furoate (ARNUITY ELLIPTA ) 100 MCG/ACT AEPB Inhale 1 puff into the lungs daily. 30 each 5   GAMMA AMINOBUTYRIC ACID PO Take 500 mg by mouth daily.     loperamide (IMODIUM) 2 MG capsule Take 2 mg by mouth as needed.      LORazepam (ATIVAN) 0.5 MG tablet Take 0.25-0.5 mg by mouth 2 (two) times daily as needed.     Melatonin 3 MG CAPS 2-3 po qhs  0   metroNIDAZOLE  (METROGEL ) 0.75 % gel Apply 1 application topically 2 (two) times daily. 45 g 5   MILK THISTLE PO Take 6 capsules by mouth daily. Reported  on 06/02/2015     nystatin -triamcinolone  ointment (MYCOLOG) Apply 1 application topically 2 (two) times daily. 30 g 12   Omega-3 Fatty Acids (FISH OIL PO) Take 2 capsules by mouth 2 (two) times daily. 2 by mouth three times daily     OVER THE COUNTER MEDICATION Take 2 capsules by mouth daily. Over the counter Zyflamend herbal joint health     RESVERATROL PO Take 2 tablets by mouth daily.  Reported on 06/02/2015     TURMERIC PO Take 2 tablets by mouth 2 (two) times daily.      predniSONE  (DELTASONE ) 20 MG tablet Take 2 pills a day for 3 days, then 1 pill a day for 3 days if needed (Patient not taking: Reported on 08/17/2023) 9 tablet 0   thyroid  (ARMOUR THYROID ) 60 MG tablet Take 1 tablet (60 mg total) by mouth daily. (Patient not taking: Reported on 08/17/2023) 90 tablet 0   No current facility-administered medications for this visit.     Review of Systems    She denies chest pain, palpitations, pnd, orthopnea, n, v, dizziness, syncope, edema, weight gain, or early satiety. All other systems reviewed and are otherwise negative except as noted above.   Physical Exam    VS:  BP 108/72 (BP Location: Left Arm, Patient Position: Sitting)   Pulse 92   Ht 5' 5 (1.651 m)   Wt 170 lb 3.2 oz (77.2 kg)   SpO2 97%   BMI 28.32 kg/m   GEN: Well nourished, well developed, in no acute distress. HEENT: normal. Neck: Supple, no JVD, carotid bruits, or masses. Cardiac: IRIR, no murmurs, rubs, or gallops. No clubbing, cyanosis, edema.  Radials/DP/PT 2+ and equal bilaterally.  Respiratory:  Respirations regular and unlabored, clear to auscultation bilaterally. GI: Soft, nontender, nondistended, BS + x 4. MS: no deformity or atrophy. Skin: warm and dry, no rash. Neuro:  Strength and sensation are intact. Psych: Normal affect.  Accessory Clinical Findings    ECG personally reviewed by me today - EKG Interpretation Date/Time:  Friday August 17 2023 07:58:27 EDT Ventricular Rate:  92 PR Interval:    QRS  Duration:  96 QT Interval:  350 QTC Calculation: 432 R Axis:   -36  Text Interpretation: Atrial fibrillation Left axis deviation Incomplete right bundle branch block When compared with ECG of 02-Aug-2023 09:53, No significant change was found Confirmed by Daneen Perkins ([id number]) on 08/17/2023 8:21:10 AM  - no acute changes.   Lab Results  Component Value Date   WBC 8.6 08/17/2023   HGB 14.7 08/17/2023   HCT 45.4 08/17/2023   MCV 98 (H) 08/17/2023   PLT 331 08/17/2023   Lab Results  Component Value Date   CREATININE 0.82 08/17/2023   BUN 15 08/17/2023   NA 139 08/17/2023   K 4.7 08/17/2023   CL 102 08/17/2023   CO2 19 (L) 08/17/2023   Lab Results  Component Value Date   ALT 9 08/17/2023   AST 17 08/17/2023   ALKPHOS 86 08/17/2023   BILITOT 1.1 08/17/2023   Lab Results  Component Value Date   CHOL 242 (H) 04/30/2015   HDL 44 (L) 04/30/2015   LDLCALC 130 (H) 04/30/2015   LDLDIRECT 141.5 03/21/2013   TRIG 338 (H) 04/30/2015   CHOLHDL 5.5 (H) 04/30/2015    Lab Results  Component Value Date   HGBA1C 5.4 03/21/2013    Assessment & Plan    1. Chest pain/cardiomyopathy/dyspnea on exertion: Recent episode of chest  pain which she describes as a pressure that radiated to her back, lasted approximately 1 hour and resolved spontaneously.  ED workup was unremarkable.  She has stable chronic dyspnea in the setting of COPD.  She denies any further chest pain.  Euvolemic and well compensated on exam.  Recent echocardiogram in 07/2023 showed EF 45 to 50%, mildly decreased LV function, LV global hypokinesis, degenerative mitral valve with moderate to severe mitral valve regurgitation, posteriorly directed jet, low  normal RV systolic function, severely dilated left atrium, mild to moderate tricuspid valve regurgitation.  Reviewed with Dr. Ladona, DOD.  Will pursue Lexiscan Myoview to rule out ischemia in the setting of new cardiomyopathy.  Encouraged DCCV as below, consider repeat echo upon  restoration of normal sinus rhythm, consider escalation of GDMT pending stress test, possible repeat echo.   Informed Consent   Shared Decision Making/Informed Consent The risks [chest pain, shortness of breath, cardiac arrhythmias, dizziness, blood pressure fluctuations, myocardial infarction, stroke/transient ischemic attack, nausea, vomiting, allergic reaction, radiation exposure, metallic taste sensation and life-threatening complications (estimated to be 1 in 10,000)], benefits (risk stratification, diagnosing coronary artery disease, treatment guidance) and alternatives of a nuclear stress test were discussed in detail with Ms. Shawgo and she agrees to proceed.    2. Mitral valve regurgitation: Moderate to severe on most recent echocardiogram.  Recommend rhythm control for atrial fibrillation with repeat echocardiogram upon restoration of normal sinus rhythm to reassess mitral valve.  3. Persistent atrial fibrillation: Rate controlled.  She has only been taking her anticoagulation for 2 weeks.  She has follow-up with the A-fib clinic on 08/30/2023.  Would recommend pursuing DCCV at that time, she is agreeable.  Encouraged adherence to Eliquis .  She is high risk for recurrent atrial fibrillation, she may require antiarrhythmic therapy in the future if she fails to maintain sinus rhythm.  Will update CBC, CMET, TSH, Mg.  Reviewed ED precautions.  Continue carvedilol, Eliquis .  4. Hypertension: BP well controlled. Continue current antihypertensive regimen.   5. Hypothyroidism: No recent TSH on file.  Will update. She is no longer taking her thyroid  Armour. Pending lab results, recommend follow-up with PCP.  6. Disposition: Follow-up in 6 to 8 weeks.    Damien JAYSON Braver, NP 08/20/2023, 7:42 PM

## 2023-08-18 LAB — COMPREHENSIVE METABOLIC PANEL WITH GFR
ALT: 9 IU/L (ref 0–32)
AST: 17 IU/L (ref 0–40)
Albumin: 4.4 g/dL (ref 3.8–4.8)
Alkaline Phosphatase: 86 IU/L (ref 44–121)
BUN/Creatinine Ratio: 18 (ref 12–28)
BUN: 15 mg/dL (ref 8–27)
Bilirubin Total: 1.1 mg/dL (ref 0.0–1.2)
CO2: 19 mmol/L — ABNORMAL LOW (ref 20–29)
Calcium: 9.8 mg/dL (ref 8.7–10.3)
Chloride: 102 mmol/L (ref 96–106)
Creatinine, Ser: 0.82 mg/dL (ref 0.57–1.00)
Globulin, Total: 2.5 g/dL (ref 1.5–4.5)
Glucose: 99 mg/dL (ref 70–99)
Potassium: 4.7 mmol/L (ref 3.5–5.2)
Sodium: 139 mmol/L (ref 134–144)
Total Protein: 6.9 g/dL (ref 6.0–8.5)
eGFR: 75 mL/min/1.73 (ref >59)

## 2023-08-18 LAB — CBC
Hematocrit: 45.4 % (ref 34.0–46.6)
Hemoglobin: 14.7 g/dL (ref 11.1–15.9)
MCH: 31.8 pg (ref 26.6–33.0)
MCHC: 32.4 g/dL (ref 31.5–35.7)
MCV: 98 fL — ABNORMAL HIGH (ref 79–97)
Platelets: 331 x10E3/uL (ref 150–450)
RBC: 4.62 x10E6/uL (ref 3.77–5.28)
RDW: 12.2 % (ref 11.7–15.4)
WBC: 8.6 x10E3/uL (ref 3.4–10.8)

## 2023-08-18 LAB — TSH: TSH: 2.73 u[IU]/mL (ref 0.450–4.500)

## 2023-08-18 LAB — MAGNESIUM: Magnesium: 2.3 mg/dL (ref 1.6–2.3)

## 2023-08-20 ENCOUNTER — Encounter: Payer: Self-pay | Admitting: Nurse Practitioner

## 2023-08-22 ENCOUNTER — Ambulatory Visit: Payer: Self-pay | Admitting: Nurse Practitioner

## 2023-08-24 ENCOUNTER — Other Ambulatory Visit: Payer: Self-pay | Admitting: Nurse Practitioner

## 2023-08-24 DIAGNOSIS — I429 Cardiomyopathy, unspecified: Secondary | ICD-10-CM

## 2023-08-24 DIAGNOSIS — R072 Precordial pain: Secondary | ICD-10-CM

## 2023-08-24 DIAGNOSIS — R0609 Other forms of dyspnea: Secondary | ICD-10-CM

## 2023-08-27 ENCOUNTER — Ambulatory Visit (HOSPITAL_COMMUNITY)
Admission: RE | Admit: 2023-08-27 | Discharge: 2023-08-27 | Disposition: A | Discharge status: Home / Self Care | Source: Ambulatory Visit | Attending: Cardiology | Admitting: Cardiology

## 2023-08-27 DIAGNOSIS — R072 Precordial pain: Secondary | ICD-10-CM | POA: Diagnosis not present

## 2023-08-27 DIAGNOSIS — R0609 Other forms of dyspnea: Secondary | ICD-10-CM | POA: Insufficient documentation

## 2023-08-27 DIAGNOSIS — I429 Cardiomyopathy, unspecified: Secondary | ICD-10-CM | POA: Insufficient documentation

## 2023-08-27 LAB — MYOCARDIAL PERFUSION IMAGING
LV dias vol: 113 mL (ref 46–106)
LV sys vol: 43 mL (ref 3.8–5.2)
Nuc Stress EF: 62 %
Peak HR: 99 {beats}/min
Rest HR: 83 {beats}/min
Rest Nuclear Isotope Dose: 10.9 mCi
SDS: 0
SRS: 5
SSS: 2
Stress Nuclear Isotope Dose: 32.8 mCi
TID: 0.96

## 2023-08-27 MED ORDER — REGADENOSON 0.4 MG/5ML IV SOLN
INTRAVENOUS | Status: AC
  Filled 2023-08-27: qty 5

## 2023-08-27 MED ORDER — TECHNETIUM TC 99M TETROFOSMIN IV KIT
10.9000 | PACK | Freq: Once | INTRAVENOUS | Status: AC | PRN
  Administered 2023-08-27: 10.9 via INTRAVENOUS

## 2023-08-27 MED ORDER — TECHNETIUM TC 99M TETROFOSMIN IV KIT
32.8000 | PACK | Freq: Once | INTRAVENOUS | Status: AC | PRN
Start: 2023-08-27 — End: 2023-08-27
  Administered 2023-08-27: 32.8 via INTRAVENOUS

## 2023-08-27 MED ORDER — REGADENOSON 0.4 MG/5ML IV SOLN
0.4000 mg | Freq: Once | INTRAVENOUS | Status: AC
  Administered 2023-08-27: 0.4 mg via INTRAVENOUS

## 2023-08-30 ENCOUNTER — Ambulatory Visit (HOSPITAL_COMMUNITY)
Admission: RE | Admit: 2023-08-30 | Discharge: 2023-08-30 | Disposition: A | Discharge status: Home / Self Care | Source: Ambulatory Visit | Attending: Physician Assistant | Admitting: Physician Assistant

## 2023-08-30 ENCOUNTER — Encounter (HOSPITAL_COMMUNITY): Payer: Self-pay | Admitting: Physician Assistant

## 2023-08-30 VITALS — BP 116/80 | HR 87 | Ht 65.0 in | Wt 169.8 lb

## 2023-08-30 DIAGNOSIS — D6869 Other thrombophilia: Secondary | ICD-10-CM

## 2023-08-30 DIAGNOSIS — I4819 Other persistent atrial fibrillation: Secondary | ICD-10-CM | POA: Diagnosis not present

## 2023-08-30 NOTE — H&P (View-Only) (Signed)
 Primary Care Physician: Burney Darice CROME, MD Primary Cardiologist: Gordy Bergamo, MD Electrophysiologist: None  Referring Physician: ED   [name] is a 73 y.o. female with a history of uterine cancer, hypothyroidism, HTN, VHD, CHF, atrial fibrillation who presents for follow up in the [facility].  The patient presented to the ED 07/25/23 with symptoms of epigastric pain and nausea. The symptoms lasted about one hour and then resolved. She was noted to be in afib with controlled rates. Patient stated she had previously been diagnosed with afib in 2023 by her PCP. Patient is unsure if she has been persistent since that time as she is asymptomatic. Started on Eliquis  08/02/23.  Patient returns for follow up for atrial fibrillation. She remains in afib today with symptoms of fatigue. She denies any bleeding issues on anticoagulation. Recent echo showed mildly reduced EF 45-50%, moderate to severe MR.   Today, she  denies symptoms of palpitations, chest pain, shortness of breath, orthopnea, PND, lower extremity edema, dizziness, presyncope, syncope, snoring, daytime somnolence, bleeding, or neurologic sequela. The patient is tolerating medications without difficulties and is otherwise without complaint today.    Atrial Fibrillation Risk Factors:  she does not have symptoms or diagnosis of sleep apnea. she does not have a history of rheumatic fever. she does have a history of alcohol use.   Atrial Fibrillation Management history:  Previous antiarrhythmic drugs: none Previous cardioversions: none Previous ablations: none Anticoagulation history: Eliquis   ROS- All systems are reviewed and negative except as per the HPI above.  Past Medical History:  Diagnosis Date   Allergic rhinitis    Asthma    Atypical nevus 10/09/2003   left ant thigh-mod (EXC)   Atypical nevus 10/04/2005   left calf-moderate   Cancer (HCC) 04/2008   uterine   Depression    GERD  (gastroesophageal reflux disease)    History of uterine cancer    Hypothyroidism    Cytomel Rx'ed by Dr.Vaughan    Current Outpatient Medications  Medication Sig Dispense Refill   albuterol  (VENTOLIN  HFA) 108 (90 Base) MCG/ACT inhaler INHALE 2 PUFFS EVERY 4 HOURS AS NEEDED FOR WHEEZING 18 each 1   apixaban  (ELIQUIS ) 5 MG TABS tablet Take 1 tablet (5 mg total) by mouth 2 (two) times daily. 60 tablet 3   b complex vitamins tablet Take 1 tablet by mouth daily.     buPROPion (WELLBUTRIN XL) 300 MG 24 hr tablet Take 300 mg by mouth every morning.     carvedilol (COREG) 6.25 MG tablet Take 6.25 mg by mouth 2 (two) times daily.     Cholecalciferol  (VITAMIN D -3 PO) Take 1 tablet by mouth daily.     Coenzyme Q10 (COQ10) 200 MG CAPS Take by mouth. 1 by mouth 4-5 x weekly      FLUoxetine  (PROZAC ) 20 MG capsule TAKE ONE CAPSULE BY MOUTH EVERY DAY 90 capsule 3   fluticasone  (FLONASE ) 50 MCG/ACT nasal spray Place 1 spray into the nose as directed. 1 spray in each nostril twice a day as needed. Use the crossover technique as discussed 16 g 2   Fluticasone  Furoate (ARNUITY ELLIPTA ) 100 MCG/ACT AEPB Inhale 1 puff into the lungs daily. 30 each 5   GAMMA AMINOBUTYRIC ACID PO Take 500 mg by mouth daily.     loperamide (IMODIUM) 2 MG capsule Take 2 mg by mouth as needed.      LORazepam (ATIVAN) 0.5 MG tablet Take 0.25-0.5 mg by mouth 2 (two) times  daily as needed.     Melatonin 3 MG CAPS 2-3 po qhs  0   metroNIDAZOLE  (METROGEL ) 0.75 % gel Apply 1 application topically 2 (two) times daily. 45 g 5   MILK THISTLE PO Take 6 capsules by mouth daily. Reported on 06/02/2015     nystatin -triamcinolone  ointment (MYCOLOG) Apply 1 application topically 2 (two) times daily. 30 g 12   Omega-3 Fatty Acids (FISH OIL PO) Take 2 capsules by mouth 2 (two) times daily. 2 by mouth three times daily     OVER THE COUNTER MEDICATION Take 2 capsules by mouth daily. Over the counter Zyflamend herbal joint health     predniSONE   (DELTASONE ) 20 MG tablet Take 2 pills a day for 3 days, then 1 pill a day for 3 days if needed 9 tablet 0   RESVERATROL PO Take 2 tablets by mouth daily. Reported on 06/02/2015     thyroid  (ARMOUR THYROID ) 60 MG tablet Take 1 tablet (60 mg total) by mouth daily. 90 tablet 0   TURMERIC PO Take 2 tablets by mouth 2 (two) times daily.      No current facility-administered medications for this encounter.    Physical Exam: BP 116/80   Pulse 87   Ht 5' 5 (1.651 m)   Wt 77 kg   BMI 28.26 kg/m   GEN: Well nourished, well developed in no acute distress CARDIAC: Irregularly irregular rate and rhythm, no murmurs, rubs, gallops RESPIRATORY:  Clear to auscultation without rales, wheezing or rhonchi  ABDOMEN: Soft, non-tender, non-distended EXTREMITIES:  No edema; No deformity    Wt Readings from Last 3 Encounters:  08/30/23 77 kg  08/17/23 77.2 kg  08/02/23 77.7 kg     EKG today demonstrates  Afib Vent. rate 87 BPM PR interval * ms QRS duration 98 ms QT/QTcB 358/430 ms   Echo 08/07/23  1. Left ventricular ejection fraction, by estimation, is 45 to 50%. The  left ventricle has mildly decreased function. The left ventricle  demonstrates global hypokinesis. The left ventricular internal cavity size  was mildly to moderately dilated. Left  ventricular diastolic function could not be evaluated.   2. Suspect severe MR given pulmonary vein reversal. Posteriorly directed  jet. Would recommend TEE for further characterization . The mitral valve  is degenerative. Moderate to severe mitral valve regurgitation. No  evidence of mitral stenosis.   3. Right ventricular systolic function is low normal. The right  ventricular size is normal. There is normal pulmonary artery systolic  pressure. The estimated right ventricular systolic pressure is 35.5 mmHg.   4. Left atrial size was severely dilated.   5. Tricuspid valve regurgitation is mild to moderate.   6. The aortic valve is normal in  structure. There is mild thickening of  the aortic valve. Aortic valve regurgitation is not visualized. No aortic  stenosis is present.   7. The inferior vena cava is normal in size with greater than 50%  respiratory variability, suggesting right atrial pressure of 3 mmHg.    CHA2DS2-VASc Score = 3  The patient's score is based upon: CHF History: 0 HTN History: 1 Diabetes History: 0 Stroke History: 0 Vascular Disease History: 0 Age Score: 1 Gender Score: 1       ASSESSMENT AND PLAN: Persistent Atrial Fibrillation (ICD10:  I48.19) The patient's CHA2DS2-VASc score is 3, indicating a 3.2% annual risk of stroke.   Patient remains in rate controlled afib.  We discussed rhythm control options today. Will plan for DCCV  now that she has been on >3 weeks of anticoagulation.  Continue Eliquis  5 mg BID Continue carvedilol 6.25 mg BID Check bmet/cbc  Secondary Hypercoagulable State (ICD10:  D68.69) The patient is at significant risk for stroke/thromboembolism based upon her CHA2DS2-VASc Score of 3.  Continue Apixaban  (Eliquis ). No bleeding issues.   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    Follow up with Damien Braver as scheduled. AF clinic if she has quick return of afib.    Informed Consent   Shared Decision Making/Informed Consent The risks (stroke, cardiac arrhythmias rarely resulting in the need for a temporary or permanent pacemaker, skin irritation or burns and complications associated with conscious sedation including aspiration, arrhythmia, respiratory failure and death), benefits (restoration of normal sinus rhythm) and alternatives of a direct current cardioversion were explained in detail to Ms. Mcnay and she agrees to proceed.         [facility] [address] [place], [place] [address] [phone]

## 2023-08-30 NOTE — Progress Notes (Addendum)
 Primary Care Physician: Burney Darice CROME, MD Primary Cardiologist: Gordy Bergamo, MD Electrophysiologist: None  Referring Physician: ED   [name] is a 73 y.o. female with a history of uterine cancer, hypothyroidism, HTN, VHD, CHF, atrial fibrillation who presents for follow up in the [facility].  The patient presented to the ED 07/25/23 with symptoms of epigastric pain and nausea. The symptoms lasted about one hour and then resolved. She was noted to be in afib with controlled rates. Patient stated she had previously been diagnosed with afib in 2023 by her PCP. Patient is unsure if she has been persistent since that time as she is asymptomatic. Started on Eliquis  08/02/23.  Patient returns for follow up for atrial fibrillation. She remains in afib today with symptoms of fatigue. She denies any bleeding issues on anticoagulation. Recent echo showed mildly reduced EF 45-50%, moderate to severe MR.   Today, she  denies symptoms of palpitations, chest pain, shortness of breath, orthopnea, PND, lower extremity edema, dizziness, presyncope, syncope, snoring, daytime somnolence, bleeding, or neurologic sequela. The patient is tolerating medications without difficulties and is otherwise without complaint today.    Atrial Fibrillation Risk Factors:  she does not have symptoms or diagnosis of sleep apnea. she does not have a history of rheumatic fever. she does have a history of alcohol use.   Atrial Fibrillation Management history:  Previous antiarrhythmic drugs: none Previous cardioversions: none Previous ablations: none Anticoagulation history: Eliquis   ROS- All systems are reviewed and negative except as per the HPI above.  Past Medical History:  Diagnosis Date   Allergic rhinitis    Asthma    Atypical nevus 10/09/2003   left ant thigh-mod (EXC)   Atypical nevus 10/04/2005   left calf-moderate   Cancer (HCC) 04/2008   uterine   Depression    GERD  (gastroesophageal reflux disease)    History of uterine cancer    Hypothyroidism    Cytomel Rx'ed by Dr.Vaughan    Current Outpatient Medications  Medication Sig Dispense Refill   albuterol  (VENTOLIN  HFA) 108 (90 Base) MCG/ACT inhaler INHALE 2 PUFFS EVERY 4 HOURS AS NEEDED FOR WHEEZING 18 each 1   apixaban  (ELIQUIS ) 5 MG TABS tablet Take 1 tablet (5 mg total) by mouth 2 (two) times daily. 60 tablet 3   b complex vitamins tablet Take 1 tablet by mouth daily.     buPROPion (WELLBUTRIN XL) 300 MG 24 hr tablet Take 300 mg by mouth every morning.     carvedilol (COREG) 6.25 MG tablet Take 6.25 mg by mouth 2 (two) times daily.     Cholecalciferol  (VITAMIN D -3 PO) Take 1 tablet by mouth daily.     Coenzyme Q10 (COQ10) 200 MG CAPS Take by mouth. 1 by mouth 4-5 x weekly      FLUoxetine  (PROZAC ) 20 MG capsule TAKE ONE CAPSULE BY MOUTH EVERY DAY 90 capsule 3   fluticasone  (FLONASE ) 50 MCG/ACT nasal spray Place 1 spray into the nose as directed. 1 spray in each nostril twice a day as needed. Use the crossover technique as discussed 16 g 2   Fluticasone  Furoate (ARNUITY ELLIPTA ) 100 MCG/ACT AEPB Inhale 1 puff into the lungs daily. 30 each 5   GAMMA AMINOBUTYRIC ACID PO Take 500 mg by mouth daily.     loperamide (IMODIUM) 2 MG capsule Take 2 mg by mouth as needed.      LORazepam (ATIVAN) 0.5 MG tablet Take 0.25-0.5 mg by mouth 2 (two) times  daily as needed.     Melatonin 3 MG CAPS 2-3 po qhs  0   metroNIDAZOLE  (METROGEL ) 0.75 % gel Apply 1 application topically 2 (two) times daily. 45 g 5   MILK THISTLE PO Take 6 capsules by mouth daily. Reported on 06/02/2015     nystatin -triamcinolone  ointment (MYCOLOG) Apply 1 application topically 2 (two) times daily. 30 g 12   Omega-3 Fatty Acids (FISH OIL PO) Take 2 capsules by mouth 2 (two) times daily. 2 by mouth three times daily     OVER THE COUNTER MEDICATION Take 2 capsules by mouth daily. Over the counter Zyflamend herbal joint health     predniSONE   (DELTASONE ) 20 MG tablet Take 2 pills a day for 3 days, then 1 pill a day for 3 days if needed 9 tablet 0   RESVERATROL PO Take 2 tablets by mouth daily. Reported on 06/02/2015     thyroid  (ARMOUR THYROID ) 60 MG tablet Take 1 tablet (60 mg total) by mouth daily. 90 tablet 0   TURMERIC PO Take 2 tablets by mouth 2 (two) times daily.      No current facility-administered medications for this encounter.    Physical Exam: BP 116/80   Pulse 87   Ht 5' 5 (1.651 m)   Wt 77 kg   BMI 28.26 kg/m   GEN: Well nourished, well developed in no acute distress CARDIAC: Irregularly irregular rate and rhythm, no murmurs, rubs, gallops RESPIRATORY:  Clear to auscultation without rales, wheezing or rhonchi  ABDOMEN: Soft, non-tender, non-distended EXTREMITIES:  No edema; No deformity    Wt Readings from Last 3 Encounters:  08/30/23 77 kg  08/17/23 77.2 kg  08/02/23 77.7 kg     EKG today demonstrates  Afib Vent. rate 87 BPM PR interval * ms QRS duration 98 ms QT/QTcB 358/430 ms   Echo 08/07/23  1. Left ventricular ejection fraction, by estimation, is 45 to 50%. The  left ventricle has mildly decreased function. The left ventricle  demonstrates global hypokinesis. The left ventricular internal cavity size  was mildly to moderately dilated. Left  ventricular diastolic function could not be evaluated.   2. Suspect severe MR given pulmonary vein reversal. Posteriorly directed  jet. Would recommend TEE for further characterization . The mitral valve  is degenerative. Moderate to severe mitral valve regurgitation. No  evidence of mitral stenosis.   3. Right ventricular systolic function is low normal. The right  ventricular size is normal. There is normal pulmonary artery systolic  pressure. The estimated right ventricular systolic pressure is 35.5 mmHg.   4. Left atrial size was severely dilated.   5. Tricuspid valve regurgitation is mild to moderate.   6. The aortic valve is normal in  structure. There is mild thickening of  the aortic valve. Aortic valve regurgitation is not visualized. No aortic  stenosis is present.   7. The inferior vena cava is normal in size with greater than 50%  respiratory variability, suggesting right atrial pressure of 3 mmHg.    CHA2DS2-VASc Score = 3  The patient's score is based upon: CHF History: 0 HTN History: 1 Diabetes History: 0 Stroke History: 0 Vascular Disease History: 0 Age Score: 1 Gender Score: 1       ASSESSMENT AND PLAN: Persistent Atrial Fibrillation (ICD10:  I48.19) The patient's CHA2DS2-VASc score is 3, indicating a 3.2% annual risk of stroke.   Patient remains in rate controlled afib.  We discussed rhythm control options today. Will plan for DCCV  now that she has been on >3 weeks of anticoagulation.  Continue Eliquis  5 mg BID Continue carvedilol 6.25 mg BID Check bmet/cbc  Secondary Hypercoagulable State (ICD10:  D68.69) The patient is at significant risk for stroke/thromboembolism based upon her CHA2DS2-VASc Score of 3.  Continue Apixaban  (Eliquis ). No bleeding issues.   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    Follow up with Damien Braver as scheduled. AF clinic if she has quick return of afib.    Informed Consent   Shared Decision Making/Informed Consent The risks (stroke, cardiac arrhythmias rarely resulting in the need for a temporary or permanent pacemaker, skin irritation or burns and complications associated with conscious sedation including aspiration, arrhythmia, respiratory failure and death), benefits (restoration of normal sinus rhythm) and alternatives of a direct current cardioversion were explained in detail to Ms. Mcnay and she agrees to proceed.         [facility] [address] [place], [place] [address] [phone]

## 2023-08-30 NOTE — Addendum Note (Signed)
 Encounter addended by: Janel Nancy SAUNDERS, RN on: 08/30/2023 11:36 AM  Actions taken: Order list changed, Diagnosis association updated

## 2023-08-30 NOTE — Patient Instructions (Signed)
 Cardioversion scheduled qnm:Fnwijb 09/18/23 8:00 am   - Arrive at the [facility] ([address])  and check in with ADMITTING at 8:00am   - Do not eat or drink anything after midnight the night prior to your procedure.   - Take all your morning medication (except diabetic medications) with a sip of water prior to arrival.  - Do NOT miss any doses of your blood thinner - if you should miss a dose or take a dose more than 4 hours late -- please notify our office immediately.  - You will not be able to drive home after your procedure. Please ensure you have a responsible adult to drive you home. You will need someone with you for 24 hours post procedure.     - Expect to be in the procedural area approximately 2 hours.   - If you feel as if you go back into normal rhythm prior to scheduled cardioversion, please notify our office immediately.   If your procedure is canceled in the cardioversion suite you will be charged a cancellation fee.

## 2023-08-31 ENCOUNTER — Ambulatory Visit (HOSPITAL_COMMUNITY): Payer: Self-pay | Admitting: Physician Assistant

## 2023-08-31 LAB — BASIC METABOLIC PANEL WITH GFR
BUN/Creatinine Ratio: 16 (ref 12–28)
BUN: 12 mg/dL (ref 8–27)
CO2: 21 mmol/L (ref 20–29)
Calcium: 9.4 mg/dL (ref 8.7–10.3)
Chloride: 100 mmol/L (ref 96–106)
Creatinine, Ser: 0.73 mg/dL (ref 0.57–1.00)
Glucose: 102 mg/dL — ABNORMAL HIGH (ref 70–99)
Potassium: 4.4 mmol/L (ref 3.5–5.2)
Sodium: 139 mmol/L (ref 134–144)
eGFR: 86 mL/min/1.73 (ref >59)

## 2023-08-31 LAB — CBC
Hematocrit: 42.7 % (ref 34.0–46.6)
Hemoglobin: 13.8 g/dL (ref 11.1–15.9)
MCH: 32.2 pg (ref 26.6–33.0)
MCHC: 32.3 g/dL (ref 31.5–35.7)
MCV: 100 fL — ABNORMAL HIGH (ref 79–97)
Platelets: 323 x10E3/uL (ref 150–450)
RBC: 4.28 x10E6/uL (ref 3.77–5.28)
RDW: 12.2 % (ref 11.7–15.4)
WBC: 6.7 x10E3/uL (ref 3.4–10.8)

## 2023-09-17 NOTE — Anesthesia Preprocedure Evaluation (Signed)
 Anesthesia Evaluation  Patient identified by MRN, date of birth, ID band Patient awake    Reviewed: Allergy & Precautions, NPO status , Patient's Chart, lab work & pertinent test results  History of Anesthesia Complications Negative for: history of anesthetic complications  Airway Mallampati: III  TM Distance: >3 FB Neck ROM: Full    Dental  (+) Dental Advisory Given   Pulmonary neg shortness of breath, asthma , neg sleep apnea, COPD, neg recent URI, former smoker   Pulmonary exam normal breath sounds clear to auscultation       Cardiovascular (-) hypertension(-) angina (-) Past MI, (-) Cardiac Stents and (-) CABG + dysrhythmias Atrial Fibrillation + Valvular Problems/Murmurs (moderate-to-severe MR)  Rhythm:Irregular Rate:Normal  Stress Test 08/27/2023:   Stress ECG is nondiagnostic for ischemia secondary to pharmacological stress and baseline ST-T changes.   Normal myocardial perfusion without obvious evidence of reversible ischemia or prior infarct.   Left ventricular function is normal. Nuclear stress EF: 62%. The left ventricular ejection fraction is normal (55-65%). End diastolic cavity size is normal.   Left ventricular size is normal, thickness preserved, calculated LVEF 62% (normal), no obvious regional wall motion abnormalities.   Prior study not available for comparison.  TTE 08/07/2023: IMPRESSIONS    1. Left ventricular ejection fraction, by estimation, is 45 to 50%. The  left ventricle has mildly decreased function. The left ventricle  demonstrates global hypokinesis. The left ventricular internal cavity size  was mildly to moderately dilated. Left  ventricular diastolic function could not be evaluated.   2. Suspect severe MR given pulmonary vein reversal. Posteriorly directed  jet. Would recommend TEE for further characterization . The mitral valve  is degenerative. Moderate to severe mitral valve regurgitation.  No  evidence of mitral stenosis.   3. Right ventricular systolic function is low normal. The right  ventricular size is normal. There is normal pulmonary artery systolic  pressure. The estimated right ventricular systolic pressure is 35.5 mmHg.   4. Left atrial size was severely dilated.   5. Tricuspid valve regurgitation is mild to moderate.   6. The aortic valve is normal in structure. There is mild thickening of  the aortic valve. Aortic valve regurgitation is not visualized. No aortic  stenosis is present.   7. The inferior vena cava is normal in size with greater than 50%  respiratory variability, suggesting right atrial pressure of 3 mmHg.      Neuro/Psych  PSYCHIATRIC DISORDERS  Depression    negative neurological ROS     GI/Hepatic Neg liver ROS,GERD  ,,  Endo/Other  neg diabetesHypothyroidism    Renal/GU negative Renal ROS     Musculoskeletal   Abdominal   Peds  Hematology negative hematology ROS (+)   Anesthesia Other Findings Last Eliquis : this morning  Reproductive/Obstetrics H/o uterine cancer                              Anesthesia Physical Anesthesia Plan  ASA: 3  Anesthesia Plan: General   Post-op Pain Management: Minimal or no pain anticipated   Induction: Intravenous  PONV Risk Score and Plan: 3 and TIVA and Treatment may vary due to age or medical condition  Airway Management Planned: Natural Airway and Nasal Cannula  Additional Equipment:   Intra-op Plan:   Post-operative Plan:   Informed Consent: I have reviewed the patients History and Physical, chart, labs and discussed the procedure including the risks, benefits and alternatives for  the proposed anesthesia with the patient or authorized representative who has indicated his/her understanding and acceptance.       Plan Discussed with: Anesthesiologist and CRNA  Anesthesia Plan Comments: (Risks of general anesthesia discussed including, but not limited  to, sore throat, hoarse voice, chipped/damaged teeth, injury to vocal cords, nausea and vomiting, allergic reactions, lung infection, heart attack, stroke, and death. All questions answered. )         Anesthesia Quick Evaluation

## 2023-09-17 NOTE — Progress Notes (Signed)
 Pt called for pre procedure instructions. Arrival time 0730 NPO after midnight explained Instructed to take am meds with sip of water and confirmed blood thinner consistency. Instructed pt need for ride home tomorrow and have responsible adult with them for 24 hrs post procedure.

## 2023-09-18 ENCOUNTER — Ambulatory Visit (HOSPITAL_BASED_OUTPATIENT_CLINIC_OR_DEPARTMENT_OTHER): Payer: Self-pay | Admitting: Anesthesiology

## 2023-09-18 ENCOUNTER — Other Ambulatory Visit: Payer: Self-pay

## 2023-09-18 ENCOUNTER — Ambulatory Visit (HOSPITAL_COMMUNITY): Payer: Self-pay | Admitting: Anesthesiology

## 2023-09-18 ENCOUNTER — Encounter (HOSPITAL_COMMUNITY): Payer: Self-pay | Admitting: Internal Medicine

## 2023-09-18 ENCOUNTER — Encounter (HOSPITAL_COMMUNITY)
Admission: RE | Disposition: A | Discharge status: Home / Self Care | Payer: Self-pay | Source: Home / Self Care | Attending: Internal Medicine

## 2023-09-18 ENCOUNTER — Ambulatory Visit (HOSPITAL_COMMUNITY)
Admission: RE | Admit: 2023-09-18 | Discharge: 2023-09-18 | Disposition: A | Discharge status: Home / Self Care | Attending: Internal Medicine | Admitting: Internal Medicine

## 2023-09-18 DIAGNOSIS — I081 Rheumatic disorders of both mitral and tricuspid valves: Secondary | ICD-10-CM | POA: Diagnosis not present

## 2023-09-18 DIAGNOSIS — I4891 Unspecified atrial fibrillation: Secondary | ICD-10-CM

## 2023-09-18 DIAGNOSIS — E039 Hypothyroidism, unspecified: Secondary | ICD-10-CM | POA: Insufficient documentation

## 2023-09-18 DIAGNOSIS — I5022 Chronic systolic (congestive) heart failure: Secondary | ICD-10-CM | POA: Insufficient documentation

## 2023-09-18 DIAGNOSIS — F32A Depression, unspecified: Secondary | ICD-10-CM | POA: Diagnosis not present

## 2023-09-18 DIAGNOSIS — Z79899 Other long term (current) drug therapy: Secondary | ICD-10-CM | POA: Insufficient documentation

## 2023-09-18 DIAGNOSIS — I4819 Other persistent atrial fibrillation: Secondary | ICD-10-CM | POA: Insufficient documentation

## 2023-09-18 DIAGNOSIS — Z8542 Personal history of malignant neoplasm of other parts of uterus: Secondary | ICD-10-CM | POA: Insufficient documentation

## 2023-09-18 DIAGNOSIS — Z7901 Long term (current) use of anticoagulants: Secondary | ICD-10-CM | POA: Diagnosis not present

## 2023-09-18 DIAGNOSIS — D6869 Other thrombophilia: Secondary | ICD-10-CM | POA: Diagnosis not present

## 2023-09-18 DIAGNOSIS — I110 Hypertensive heart disease with heart failure: Secondary | ICD-10-CM | POA: Diagnosis not present

## 2023-09-18 DIAGNOSIS — K219 Gastro-esophageal reflux disease without esophagitis: Secondary | ICD-10-CM | POA: Insufficient documentation

## 2023-09-18 DIAGNOSIS — Z79890 Hormone replacement therapy (postmenopausal): Secondary | ICD-10-CM | POA: Diagnosis not present

## 2023-09-18 HISTORY — PX: CARDIOVERSION: EP1203

## 2023-09-18 SURGERY — CARDIOVERSION (CATH LAB)
Anesthesia: General

## 2023-09-18 MED ORDER — SODIUM CHLORIDE 0.9 % IV SOLN: INTRAVENOUS | Status: DC

## 2023-09-18 MED ORDER — PROPOFOL 10 MG/ML IV BOLUS
INTRAVENOUS | Status: DC | PRN
  Administered 2023-09-18: 60 mg via INTRAVENOUS

## 2023-09-18 MED ORDER — SODIUM CHLORIDE 0.9% FLUSH
INTRAVENOUS | Status: DC | PRN
  Administered 2023-09-18: 6 mL via INTRAVENOUS

## 2023-09-18 SURGICAL SUPPLY — 1 items: PAD DEFIB RADIO PHYSIO CONN (PAD) ×2 IMPLANT

## 2023-09-18 NOTE — Discharge Instructions (Signed)

## 2023-09-18 NOTE — Interval H&P Note (Signed)
 History and Physical Interval Note:  09/18/2023 7:46 AM  [name]  has presented today for surgery, with the diagnosis of AFIB.  The various methods of treatment have been discussed with the patient and family. After consideration of risks, benefits and other options for treatment, the patient has consented to  Procedure(s): CARDIOVERSION (N/A) as a surgical intervention.  The patient's history has been reviewed, patient examined, no change in status, stable for surgery.  I have reviewed the patient's chart and labs.  Questions were answered to the patient's satisfaction.     [name]

## 2023-09-18 NOTE — Anesthesia Postprocedure Evaluation (Signed)
 Anesthesia Post Note  Patient: [name]  Procedure(s) Performed: CARDIOVERSION     Patient location during evaluation: PACU Anesthesia Type: General Level of consciousness: awake Pain management: pain level controlled Vital Signs Assessment: post-procedure vital signs reviewed and stable Respiratory status: spontaneous breathing, nonlabored ventilation and respiratory function stable Cardiovascular status: blood pressure returned to baseline and stable Postop Assessment: no apparent nausea or vomiting Anesthetic complications: no   No notable events documented.  Last Vitals:  Vitals:   09/18/23 0736 09/18/23 0851  BP: (!) 143/95 117/81  Pulse: 99 76  Resp: 17 18  Temp: 37 C 36.8 C  SpO2: 97% 91%    Last Pain:  Vitals:   09/18/23 0851  TempSrc: Tympanic  PainSc: Asleep                 Delon Aisha Arch

## 2023-09-18 NOTE — Transfer of Care (Signed)
 Immediate Anesthesia Transfer of Care Note  Patient: [name]  Procedure(s) Performed: CARDIOVERSION  Patient Location: PACU and Cath Lab  Anesthesia Type:MAC  Level of Consciousness: drowsy  Airway & Oxygen Therapy: Patient Spontanous Breathing and Patient connected to nasal cannula oxygen  Post-op Assessment: Report given to RN and Post -op Vital signs reviewed and stable  Post vital signs: Reviewed and stable  Last Vitals:  Vitals Value Taken Time  BP    Temp    Pulse 91 09/18/23 08:35  Resp 17 09/18/23 08:35  SpO2 93 % 09/18/23 08:35  Vitals shown include unfiled device data.  Last Pain:  Vitals:   09/18/23 0841  TempSrc:   PainSc: 0-No pain         Complications: No notable events documented.

## 2023-09-18 NOTE — CV Procedure (Signed)
    CARDIOVERSION NOTE  Procedure: Electrical Cardioversion Indications:  Atrial Fibrillation  Procedure Details:  Consent: Risks of procedure as well as the alternatives and risks of each were explained to the (patient/caregiver).  Consent for procedure obtained.  Time Out: Verified patient identification, verified procedure, site/side was marked, verified correct patient position, special equipment/implants available, medications/allergies/relevent history reviewed, required imaging and test results available.  Performed  Patient placed on cardiac monitor, pulse oximetry, supplemental oxygen as necessary.  Sedation given: propofol  per anesthesia Pacer pads placed anterior and posterior chest.  Cardioverted 1 time(s).  Cardioverted at 200J biphasic.  Impression: Findings: Post procedure EKG shows: NSR Complications: None Patient did tolerate procedure well.  Plan: Successful DCCV with a single 200J biphasic shock to NSR.  Time Spent Directly with the Patient:  30 minutes   Vinie KYM Maxcy, MD, [facility], FNLA, FACP  [place]  [facility]  Medical Director of the [facility] &  Cardiovascular Risk Reduction Clinic Diplomate of the American Board of Clinical Lipidology Attending Cardiologist  Direct Dial: [phone]  Fax: [phone]  Website:  [email or web address] [name] 09/18/2023, 8:49 AM

## 2023-10-05 ENCOUNTER — Encounter: Payer: Self-pay | Admitting: Nurse Practitioner

## 2023-10-05 ENCOUNTER — Telehealth: Payer: Self-pay | Admitting: Nurse Practitioner

## 2023-10-05 ENCOUNTER — Ambulatory Visit: Attending: Nurse Practitioner | Admitting: Nurse Practitioner

## 2023-10-05 VITALS — BP 125/80 | HR 83 | Ht 65.0 in | Wt 170.0 lb

## 2023-10-05 DIAGNOSIS — I429 Cardiomyopathy, unspecified: Secondary | ICD-10-CM | POA: Diagnosis not present

## 2023-10-05 DIAGNOSIS — I340 Nonrheumatic mitral (valve) insufficiency: Secondary | ICD-10-CM

## 2023-10-05 DIAGNOSIS — R0609 Other forms of dyspnea: Secondary | ICD-10-CM

## 2023-10-05 DIAGNOSIS — I4819 Other persistent atrial fibrillation: Secondary | ICD-10-CM

## 2023-10-05 DIAGNOSIS — E039 Hypothyroidism, unspecified: Secondary | ICD-10-CM

## 2023-10-05 DIAGNOSIS — I10 Essential (primary) hypertension: Secondary | ICD-10-CM

## 2023-10-05 DIAGNOSIS — R072 Precordial pain: Secondary | ICD-10-CM | POA: Diagnosis not present

## 2023-10-05 NOTE — Progress Notes (Signed)
 Office Visit    Patient Name: [name] Date of Encounter: 10/05/2023  Primary Care Provider:  Burney Darice CROME, MD Primary Cardiologist:  Gordy Bergamo, MD  Chief Complaint    73 year old female with a history of persistent atrial fibrillation, cardiomyopathy, mitral valve regurgitation, tricuspid valve regurgitation, hypertension, hypothyroidism, uterine cancer, GERD, COPD and prior tobacco use who presents for follow-up related to atrial fibrillation, chest pain, dyspnea on exertion.  Past Medical History    Past Medical History:  Diagnosis Date   Allergic rhinitis    Asthma    Atypical nevus 10/09/2003   left ant thigh-mod (EXC)   Atypical nevus 10/04/2005   left calf-moderate   Cancer (HCC) 04/2008   uterine   Depression    GERD (gastroesophageal reflux disease)    History of uterine cancer    Hypothyroidism    Cytomel Rx'ed by Dr.Vaughan   Past Surgical History:  Procedure Laterality Date   ABDOMINAL HYSTERECTOMY  04/2008   TAH BSO   CARDIOVERSION N/A 09/18/2023   Procedure: CARDIOVERSION;  Surgeon: Mona Vinie BROCKS, MD;  Location: [facility] INVASIVE CV LAB;  Service: Cardiovascular;  Laterality: N/A;   TOTAL ABDOMINAL HYSTERECTOMY W/ BILATERAL SALPINGOOPHORECTOMY  2010    Allergies  Allergies  Allergen Reactions   Levofloxacin Rash     Labs/Other Studies Reviewed    The following studies were reviewed today:  Cardiac Studies & Procedures   ______________________________________________________________________________________________   STRESS TESTS  MYOCARDIAL PERFUSION IMAGING 08/27/2023  Interpretation Summary   Stress ECG is nondiagnostic for ischemia secondary to pharmacological stress and baseline ST-T changes.   Normal myocardial perfusion without obvious evidence of reversible ischemia or prior infarct.   Left ventricular function is normal. Nuclear stress EF: 62%. The left ventricular ejection fraction is normal (55-65%). End diastolic cavity  size is normal.   Left ventricular size is normal, thickness preserved, calculated LVEF 62% (normal), no obvious regional wall motion abnormalities.   Prior study not available for comparison.   ECHOCARDIOGRAM  ECHOCARDIOGRAM COMPLETE 08/07/2023  Narrative ECHOCARDIOGRAM REPORT    Patient Name:   [name] Date of Exam: 08/07/2023 Medical Rec #:  [id number]          Height:       66.0 in Accession #:    [id number]         Weight:       171.4 lb Date of Birth:  Sep 15, 1950          BSA:          1.873 m Patient Age:    73 years           BP:           120/72 mmHg Patient Gender: F                  HR:           81 bpm. Exam Location:  Outpatient  Procedure: 2D Echo, Cardiac Doppler and Color Doppler (Both Spectral and Color Flow Doppler were utilized during procedure).  Indications:    Atrial Fibrillation I48.91  History:        Patient has no prior history of Echocardiogram examinations. Arrythmias:Atrial Fibrillation.  Sonographer:    Jayson Gaskins Referring Phys: [id number] CLINT R FENTON  IMPRESSIONS   1. Left ventricular ejection fraction, by estimation, is 45 to 50%. The left ventricle has mildly decreased function. The left ventricle demonstrates global hypokinesis. The left ventricular internal cavity size was mildly  to moderately dilated. Left ventricular diastolic function could not be evaluated. 2. Suspect severe MR given pulmonary vein reversal. Posteriorly directed jet. Would recommend TEE for further characterization . The mitral valve is degenerative. Moderate to severe mitral valve regurgitation. No evidence of mitral stenosis. 3. Right ventricular systolic function is low normal. The right ventricular size is normal. There is normal pulmonary artery systolic pressure. The estimated right ventricular systolic pressure is 35.5 mmHg. 4. Left atrial size was severely dilated. 5. Tricuspid valve regurgitation is mild to moderate. 6. The aortic valve is normal in  structure. There is mild thickening of the aortic valve. Aortic valve regurgitation is not visualized. No aortic stenosis is present. 7. The inferior vena cava is normal in size with greater than 50% respiratory variability, suggesting right atrial pressure of 3 mmHg.  FINDINGS Left Ventricle: Left ventricular ejection fraction, by estimation, is 45 to 50%. The left ventricle has mildly decreased function. The left ventricle demonstrates global hypokinesis. The left ventricular internal cavity size was mildly to moderately dilated. There is no left ventricular hypertrophy. Left ventricular diastolic function could not be evaluated due to atrial fibrillation. Left ventricular diastolic function could not be evaluated.  Right Ventricle: The right ventricular size is normal. No increase in right ventricular wall thickness. Right ventricular systolic function is low normal. There is normal pulmonary artery systolic pressure. The tricuspid regurgitant velocity is 2.85 m/s, and with an assumed right atrial pressure of 3 mmHg, the estimated right ventricular systolic pressure is 35.5 mmHg.  Left Atrium: Left atrial size was severely dilated.  Right Atrium: Right atrial size was normal in size.  Pericardium: There is no evidence of pericardial effusion.  Mitral Valve: Suspect severe MR given pulmonary vein reversal. Posteriorly directed jet. Would recommend TEE for further characterization. The mitral valve is degenerative in appearance. Moderate to severe mitral valve regurgitation. No evidence of mitral valve stenosis. MV peak gradient, 11.6 mmHg. The mean mitral valve gradient is 4.0 mmHg.  Tricuspid Valve: The tricuspid valve is normal in structure. Tricuspid valve regurgitation is mild to moderate. No evidence of tricuspid stenosis.  Aortic Valve: The aortic valve is normal in structure. There is mild thickening of the aortic valve. Aortic valve regurgitation is not visualized. No aortic stenosis  is present. Aortic valve mean gradient measures 4.0 mmHg. Aortic valve peak gradient measures 3.1 mmHg. Aortic valve area, by VTI measures 1.20 cm.  Pulmonic Valve: The pulmonic valve was normal in structure. Pulmonic valve regurgitation is trivial. No evidence of pulmonic stenosis.  Aorta: The aortic root is normal in size and structure.  Venous: The inferior vena cava is normal in size with greater than 50% respiratory variability, suggesting right atrial pressure of 3 mmHg.  IAS/Shunts: No atrial level shunt detected by color flow Doppler.   LEFT VENTRICLE PLAX 2D LVIDd:         6.10 cm      Diastology LVIDs:         4.60 cm      LV e' medial:    10.20 cm/s LV PW:         1.30 cm      LV E/e' medial:  17.6 LV IVS:        1.30 cm      LV e' lateral:   14.00 cm/s LVOT diam:     1.60 cm      LV E/e' lateral: 12.9 LV SV:         33 LV SV  Index:   17 LVOT Area:     2.01 cm  LV Volumes (MOD) LV vol d, MOD A2C: 97.6 ml LV vol d, MOD A4C: 149.0 ml LV vol s, MOD A2C: 55.2 ml LV vol s, MOD A4C: 75.8 ml LV SV MOD A2C:     42.4 ml LV SV MOD A4C:     149.0 ml LV SV MOD BP:      59.2 ml  RIGHT VENTRICLE            IVC RV S prime:     9.36 cm/s  IVC diam: 1.80 cm TAPSE (M-mode): 2.0 cm  LEFT ATRIUM              Index        RIGHT ATRIUM           Index LA diam:        5.10 cm  2.72 cm/m   RA Area:     20.80 cm LA Vol (A2C):   131.0 ml 69.95 ml/m  RA Volume:   53.60 ml  28.62 ml/m LA Vol (A4C):   122.0 ml 65.14 ml/m LA Biplane Vol: 126.0 ml 67.28 ml/m AORTIC VALVE AV Area (Vmax):    2.07 cm AV Area (Vmean):   1.22 cm AV Area (VTI):     1.20 cm AV Vmax:           87.50 cm/s AV Vmean:          98.400 cm/s AV VTI:            0.274 m AV Peak Grad:      3.1 mmHg AV Mean Grad:      4.0 mmHg LVOT Vmax:         90.20 cm/s LVOT Vmean:        59.600 cm/s LVOT VTI:          0.163 m LVOT/AV VTI ratio: 0.59  AORTA Ao Root diam: 3.00 cm  MITRAL VALVE                TRICUSPID  VALVE MV Area (PHT): 2.48 cm     TR Peak grad:   32.5 mmHg MV Area VTI:   0.84 cm     TR Vmax:        285.00 cm/s MV Peak grad:  11.6 mmHg MV Mean grad:  4.0 mmHg     SHUNTS MV Vmax:       1.70 m/s     Systemic VTI:  0.16 m MV Vmean:      92.1 cm/s    Systemic Diam: 1.60 cm MV Decel Time: 306 msec MV E velocity: 180.00 cm/s  Morene Brownie Electronically signed by Morene Brownie Signature Date/Time: 08/07/2023/1:56:33 PM    Final          ______________________________________________________________________________________________     Recent Labs: 08/17/2023: ALT 9; Magnesium 2.3; TSH 2.730 08/30/2023: BUN 12; Creatinine, Ser 0.73; Hemoglobin 13.8; Platelets 323; Potassium 4.4; Sodium 139  Recent Lipid Panel    Component Value Date/Time   CHOL 242 (H) 04/30/2015 1514   TRIG 338 (H) 04/30/2015 1514   HDL 44 (L) 04/30/2015 1514   CHOLHDL 5.5 (H) 04/30/2015 1514   VLDL 68 (H) 04/30/2015 1514   LDLCALC 130 (H) 04/30/2015 1514   LDLDIRECT 141.5 03/21/2013 0904    History of Present Illness    73 year old female with the above past medical history including persistent atrial fibrillation, cardiomyopathy, mitral valve regurgitation, tricuspid valve regurgitation,  hypertension, hypothyroidism, uterine cancer, GERD, COPD, and prior tobacco use.    She has a prior history of atrial fibrillation, initially diagnosed by PCP in 2023.  She has been essentially A-fib unaware, previously declined anticoagulation.  History of prior tobacco use, she smoked for almost 30 years.  Her mother has a history of stroke, her father died of cirrhosis in the setting of EtOH use and acetaminophen.  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 She drinks alcohol most days of the week, 1-2 drinks per day.  She drinks 1 cup of coffee daily.  She also reports some symptoms of depression. She presented to the ED on 07/25/2023 in the setting of chest  pain. EKG showed rate controlled atrial fibrillation. Troponin negative x 2.  She declined anticoagulation.  She was referred to the A-fib clinic and was started on Eliquis . She declined DCCV at the time. Echocardiogram in 07/2023 showed EF 45 to 50%, mildly decreased LV function, LV global hypokinesis, degenerative mitral valve with moderate to severe mitral valve regurgitation, posteriorly directed jet, low normal RV systolic function, severely dilated left atrium, mild to moderate tricuspid valve regurgitation.  She was seen by general cardiology in 07/2023 and reported an episode of chest pain.  Lexiscan  Myoview  was negative for ischemia or infarction.  She was last in the office on 08/30/2023 by A-fib clinic.  She underwent successful DCCV on 09/18/2023 with restoration of normal sinus rhythm.  Repeat echocardiogram was recommended upon restoration of normal sinus rhythm.   She presents today for follow-up. Since her procedure she has been stable from a cardiac standpoint.  Unfortunately, EKG today shows rate controlled atrial fibrillation.  She reports stable dyspnea on exertion, mild fatigue.  She has had brief episodes of mild chest discomfort. She has been under significant mount of personal stress these past few weeks.  She denies any significant palpitations, dizziness, edema, PND, orthopnea, weight gain.  Home Medications    Current Outpatient Medications  Medication Sig Dispense Refill   Acetylcysteine (NAC) 500 MG CAPS Take 500 mg by mouth daily.     apixaban  (ELIQUIS ) 5 MG TABS tablet Take 1 tablet (5 mg total) by mouth 2 (two) times daily. 60 tablet 3   b complex vitamins tablet Take 1 tablet by mouth daily.     buPROPion (WELLBUTRIN XL) 300 MG 24 hr tablet Take 300 mg by mouth every morning.     carvedilol (COREG) 6.25 MG tablet Take 6.25 mg by mouth 2 (two) times daily.     Cholecalciferol  (VITAMIN D -3 PO) Take 5,000 Units by mouth daily.     Coenzyme Q10 (COQ10) 200 MG CAPS Take 1 tablet  by mouth See admin instructions. 1 by mouth 4-5 x weekly     FLUoxetine  (PROZAC ) 20 MG capsule TAKE ONE CAPSULE BY MOUTH EVERY DAY 90 capsule 3   fluticasone  (FLONASE ) 50 MCG/ACT nasal spray Place 1 spray into the nose as directed. 1 spray in each nostril twice a day as needed. Use the crossover technique as discussed 16 g 2   loperamide (IMODIUM) 2 MG capsule Take 2-4 mg by mouth as needed for diarrhea or loose stools.     LORazepam (ATIVAN) 0.5 MG tablet Take 0.25-0.5 mg by mouth 2 (two) times daily as needed for anxiety.     Menaquinone-7 (K2 PO) Take 1 tablet by mouth daily.     metroNIDAZOLE  (METROGEL ) 0.75 % gel Apply 1 application  topically 2 (two) times daily. (Patient taking differently: Apply 1 application  topically 2 (two) times daily as needed (Rosacea).) 45 g 5   Multiple Vitamin (MULTIVITAMIN PO) Take 3 tablets by mouth daily. Iron free     Omega-3 Fatty Acids (FISH OIL PO) Take 2 capsules by mouth 2 (two) times daily as needed.     OVER THE COUNTER MEDICATION Take 2 tablets by mouth 3 (three) times daily. Cardio Plus     OVER THE COUNTER MEDICATION Take 2 tablets by mouth at bedtime. Ultra Mag     OVER THE COUNTER MEDICATION Take 1 tablet by mouth at bedtime as needed (Sleep). Power to sleep PM     OVER THE COUNTER MEDICATION Take 1 drop by mouth as needed (Pain). CBD drops/drink     Quercetin 500 MG CAPS Take 500 mg by mouth daily.     THEANINE PO Take 1 tablet by mouth daily. With magnesium     TURMERIC PO Take 2 tablets by mouth 2 (two) times daily as needed (Inflammation).     Multiple Vitamins-Minerals (ZINC PO) Take 1 tablet by mouth daily. (Patient not taking: Reported on 10/05/2023)     No current facility-administered medications for this visit.     Review of Systems    She denies palpitations, pnd, orthopnea, n, v, dizziness, syncope, edema, weight gain, or early satiety. All other systems reviewed and are otherwise negative except as noted above.   Physical Exam     VS:  BP 125/80   Pulse 83   Ht 5' 5 (1.651 m)   Wt 170 lb (77.1 kg)   SpO2 94%   BMI 28.29 kg/m   GEN: Well nourished, well developed, in no acute distress. HEENT: normal. Neck: Supple, no JVD, carotid bruits, or masses. Cardiac: RRR, no murmurs, rubs, or gallops. No clubbing, cyanosis, edema.  Radials/DP/PT 2+ and equal bilaterally.  Respiratory:  Respirations regular and unlabored, clear to auscultation bilaterally. GI: Soft, nontender, nondistended, BS + x 4. MS: no deformity or atrophy. Skin: warm and dry, no rash. Neuro:  Strength and sensation are intact. Psych: Normal affect.  Accessory Clinical Findings    ECG personally reviewed by me today - EKG Interpretation Date/Time:  Friday October 05 2023 08:23:41 EDT Ventricular Rate:  83 PR Interval:    QRS Duration:  94 QT Interval:  360 QTC Calculation: 423 R Axis:   -45  Text Interpretation: Atrial fibrillation Incomplete right bundle branch block Left anterior fascicular block Cannot rule out Anterior infarct , age undetermined When compared with ECG of 18-Sep-2023 08:52, Atrial fibrillation has replaced Sinus rhythm Incomplete right bundle branch block is now Present Confirmed by Daneen Perkins ([id number]) on 10/05/2023 8:43:39 AM  - no acute changes.   Lab Results  Component Value Date   WBC 6.7 08/30/2023   HGB 13.8 08/30/2023   HCT 42.7 08/30/2023   MCV 100 (H) 08/30/2023   PLT 323 08/30/2023   Lab Results  Component Value Date   CREATININE 0.73 08/30/2023   BUN 12 08/30/2023   NA 139 08/30/2023   K 4.4 08/30/2023   CL 100 08/30/2023   CO2 21 08/30/2023   Lab Results  Component Value Date   ALT 9 08/17/2023   AST 17 08/17/2023   ALKPHOS 86 08/17/2023   BILITOT 1.1 08/17/2023   Lab Results  Component Value Date   CHOL 242 (H) 04/30/2015   HDL 44 (L) 04/30/2015   LDLCALC 130 (H) 04/30/2015   LDLDIRECT  141.5 03/21/2013   TRIG 338 (H) 04/30/2015   CHOLHDL 5.5 (H) 04/30/2015    Lab Results  Component  Value Date   HGBA1C 5.4 03/21/2013    Assessment & Plan    1. Chest pain/cardiomyopathy/dyspnea on exertion: Recent episode of chest  pain which she describes as a pressure that radiated to her back, lasted approximately 1 hour and resolved spontaneously.  ED workup was unremarkable. Recent echocardiogram in 07/2023 showed EF 45 to 50%, mildly decreased LV function, LV global hypokinesis, degenerative mitral valve with moderate to severe mitral valve regurgitation, posteriorly directed jet, low normal RV systolic function, severely dilated left atrium, mild to moderate tricuspid valve regurgitation. Lexiscan  Myoview  in 08/2023 was negative for ischemia or infarction.  Euvolemic and well compensated on exam.  She has stable dyspnea on exertion, she has had brief episodes of mild chest discomfort.  Symptoms overall stable, recent stress test overall reassuring.  She has been under significant amount of personal stress.  Additionally, she had early recurrence of atrial fibrillation as belwo.  Consider repeat echocardiogram upon restoration of sinus rhythm.  If EF remains reduced, consider escalation of GDMT.    2. Mitral valve regurgitation: Moderate to severe on most recent echocardiogram.  Plan for repeat echocardiogram upon restoration of normal sinus rhythm to reassess mitral valve.   3. Persistent atrial fibrillation: S/p recent DCCV in 08/2023 with early recurrence of rial fibrillation.  Rate controlled.  Will plan for follow-up with A-fib clinic as she will likely require antiarrhythmic therapy versus ablation in order to maintain sinus rhythm.  Encouraged adherence to Eliquis . Reviewed ED precautions.  Continue carvedilol, Eliquis .   4. Hypertension: BP well controlled. Continue current antihypertensive regimen.    5. Hypothyroidism: TSH was 2.730 in 07/2023.SABRA She is no longer taking her thyroid  [place]. Recommend follow-up with PCP.   6. Disposition: Follow-up with A-fib clinic in the next 1 to 2  weeks, follow-up in 3 to 4 months with general cardiology, sooner if needed.      Damien JAYSON Braver, NP 10/05/2023, 9:04 AM

## 2023-10-05 NOTE — Patient Instructions (Addendum)
 Medication Instructions:  Your physician recommends that you continue on your current medications as directed. Please refer to the Current Medication list given to you today.  *If you need a refill on your cardiac medications before your next appointment, please call your pharmacy*  Lab Work: NONE ordered at this time of appointment   Testing/Procedures: NONE ordered at this time of appointment   Follow-Up: At [facility], you and your health needs are our priority.  As part of our continuing mission to provide you with exceptional heart care, our providers are all part of one team.  This team includes your primary Cardiologist (physician) and Advanced Practice Providers or APPs (Physician Assistants and Nurse Practitioners) who all work together to provide you with the care you need, when you need it.  Your next appointment:   1-2 weeks Afib Clinic  3-4 months Dr. Ladona or Damien Braver NP  Provider:   Gordy Ladona, MD or Damien Braver, NP          We recommend signing up for the patient portal called MyChart.  Sign up information is provided on this After Visit Summary.  MyChart is used to connect with patients for Virtual Visits (Telemedicine).  Patients are able to view lab/test results, encounter notes, upcoming appointments, etc.  Non-urgent messages can be sent to your provider as well.   To learn more about what you can do with MyChart, go to [email or web address].

## 2023-10-05 NOTE — Telephone Encounter (Signed)
 Error

## 2023-10-22 ENCOUNTER — Ambulatory Visit (HOSPITAL_COMMUNITY)
Admission: RE | Admit: 2023-10-22 | Discharge: 2023-10-22 | Disposition: A | Discharge status: Home / Self Care | Source: Ambulatory Visit | Attending: Physician Assistant | Admitting: Physician Assistant

## 2023-10-22 VITALS — BP 120/70 | HR 89 | Ht 65.0 in | Wt 170.2 lb

## 2023-10-22 DIAGNOSIS — D6869 Other thrombophilia: Secondary | ICD-10-CM | POA: Diagnosis not present

## 2023-10-22 DIAGNOSIS — I483 Typical atrial flutter: Secondary | ICD-10-CM | POA: Diagnosis not present

## 2023-10-22 DIAGNOSIS — I4819 Other persistent atrial fibrillation: Secondary | ICD-10-CM

## 2023-10-22 DIAGNOSIS — I4891 Unspecified atrial fibrillation: Secondary | ICD-10-CM

## 2023-10-22 NOTE — Progress Notes (Signed)
 Primary Care Physician: Burney Darice CROME, MD Primary Cardiologist: Gordy Bergamo, MD Electrophysiologist: None  Referring Physician: ED   [name] is a 73 y.o. female with a history of uterine cancer, hypothyroidism, HTN, VHD, CHF, atrial fibrillation who presents for follow up in the [facility].  The patient presented to the ED 07/25/23 with symptoms of epigastric pain and nausea. The symptoms lasted about one hour and then resolved. She was noted to be in afib with controlled rates. Patient stated she had previously been diagnosed with afib in 2023 by her PCP. Patient is unsure if she has been persistent since that time as she is asymptomatic. Started on Eliquis  08/02/23. Echo showed mildly reduced EF 45-50%, moderate to severe MR. She is s/p DCCV 09/18/23 but was found to be back in afib at follow up on 10/05/23.  Patient returns for follow up for atrial fibrillation and atrial flutter. She is rate controlled today and unaware of her atrial flutter. No bleeding issues on anticoagulation.   Today, she  denies symptoms of palpitations, chest pain, shortness of breath, orthopnea, PND, lower extremity edema, dizziness, presyncope, syncope, snoring, daytime somnolence, bleeding, or neurologic sequela. The patient is tolerating medications without difficulties and is otherwise without complaint today.    Atrial Fibrillation Risk Factors:  she does not have symptoms or diagnosis of sleep apnea. she does not have a history of rheumatic fever. she does have a history of alcohol use.   Atrial Fibrillation Management history:  Previous antiarrhythmic drugs: none Previous cardioversions: 09/18/23 Previous ablations: none Anticoagulation history: Eliquis   ROS- All systems are reviewed and negative except as per the HPI above.  Past Medical History:  Diagnosis Date   Allergic rhinitis    Asthma    Atypical nevus 10/09/2003   left ant thigh-mod (EXC)   Atypical  nevus 10/04/2005   left calf-moderate   Cancer (HCC) 04/2008   uterine   Depression    GERD (gastroesophageal reflux disease)    History of uterine cancer    Hypothyroidism    Cytomel Rx'ed by Dr.Vaughan    Current Outpatient Medications  Medication Sig Dispense Refill   Acetylcysteine (NAC) 500 MG CAPS Take 500 mg by mouth daily.     apixaban  (ELIQUIS ) 5 MG TABS tablet Take 1 tablet (5 mg total) by mouth 2 (two) times daily. 60 tablet 3   b complex vitamins tablet Take 1 tablet by mouth daily.     budesonide-glycopyrrolate-formoterol (BREZTRI AEROSPHERE) 160-9-4.8 MCG/ACT AERO inhaler Inhale 2 puffs into the lungs 2 (two) times daily.     buPROPion (WELLBUTRIN XL) 300 MG 24 hr tablet Take 300 mg by mouth every morning.     carvedilol (COREG) 6.25 MG tablet Take 6.25 mg by mouth 2 (two) times daily.     Cholecalciferol  (VITAMIN D -3 PO) Take 5,000 Units by mouth daily.     Coenzyme Q10 (COQ10) 200 MG CAPS Take 1 tablet by mouth See admin instructions. 1 by mouth 4-5 x weekly     FLUoxetine  (PROZAC ) 20 MG capsule TAKE ONE CAPSULE BY MOUTH EVERY DAY 90 capsule 3   fluticasone  (FLONASE ) 50 MCG/ACT nasal spray Place 1 spray into the nose as directed. 1 spray in each nostril twice a day as needed. Use the crossover technique as discussed 16 g 2   loperamide (IMODIUM) 2 MG capsule Take 2-4 mg by mouth as needed for diarrhea or loose stools.     LORazepam (ATIVAN) 0.5 MG tablet  Take 0.25-0.5 mg by mouth 2 (two) times daily as needed for anxiety.     Menaquinone-7 (K2 PO) Take 1 tablet by mouth daily.     metroNIDAZOLE  (METROGEL ) 0.75 % gel Apply 1 application topically 2 (two) times daily. (Patient taking differently: Apply 1 application  topically as needed.) 45 g 5   Multiple Vitamin (MULTIVITAMIN PO) Take 3 tablets by mouth daily. Iron free     Multiple Vitamins-Minerals (ZINC PO) Take 1 tablet by mouth daily.     Omega-3 Fatty Acids (FISH OIL PO) Take 2 capsules by mouth 2 (two) times daily  as needed.     OVER THE COUNTER MEDICATION Take 2 tablets by mouth 3 (three) times daily. Cardio Plus     OVER THE COUNTER MEDICATION Take 2 tablets by mouth at bedtime. Ultra Mag     OVER THE COUNTER MEDICATION Take 1 drop by mouth as needed (Pain). CBD drops/drink     Quercetin 500 MG CAPS Take 500 mg by mouth daily.     THEANINE PO Take 1 tablet by mouth daily. With magnesium     OVER THE COUNTER MEDICATION Take 1 tablet by mouth at bedtime as needed (Sleep). Power to sleep PM (Patient not taking: Reported on 10/22/2023)     TURMERIC PO Take 2 tablets by mouth 2 (two) times daily as needed (Inflammation). (Patient not taking: Reported on 10/22/2023)     No current facility-administered medications for this encounter.    Physical Exam: BP 120/70   Pulse 89   Ht 5' 5 (1.651 m)   Wt 77.2 kg   BMI 28.32 kg/m   GEN: Well nourished, well developed in no acute distress CARDIAC: Irregularly irregular rate and rhythm, no murmurs, rubs, gallops RESPIRATORY:  Clear to auscultation without rales, wheezing or rhonchi  ABDOMEN: Soft, non-tender, non-distended EXTREMITIES:  No edema; No deformity    Wt Readings from Last 3 Encounters:  10/22/23 77.2 kg  10/05/23 77.1 kg  09/18/23 72.6 kg     EKG today demonstrates  Atrial flutter with variable block Vent. rate 89 BPM PR interval * ms QRS duration 96 ms QT/QTcB 362/440 ms   Echo 08/07/23  1. Left ventricular ejection fraction, by estimation, is 45 to 50%. The  left ventricle has mildly decreased function. The left ventricle  demonstrates global hypokinesis. The left ventricular internal cavity size  was mildly to moderately dilated. Left  ventricular diastolic function could not be evaluated.   2. Suspect severe MR given pulmonary vein reversal. Posteriorly directed  jet. Would recommend TEE for further characterization . The mitral valve  is degenerative. Moderate to severe mitral valve regurgitation. No  evidence of mitral  stenosis.   3. Right ventricular systolic function is low normal. The right  ventricular size is normal. There is normal pulmonary artery systolic  pressure. The estimated right ventricular systolic pressure is 35.5 mmHg.   4. Left atrial size was severely dilated.   5. Tricuspid valve regurgitation is mild to moderate.   6. The aortic valve is normal in structure. There is mild thickening of  the aortic valve. Aortic valve regurgitation is not visualized. No aortic  stenosis is present.   7. The inferior vena cava is normal in size with greater than 50%  respiratory variability, suggesting right atrial pressure of 3 mmHg.    CHA2DS2-VASc Score = 3  The patient's score is based upon: CHF History: 0 HTN History: 1 Diabetes History: 0 Stroke History: 0 Vascular Disease History:  0 Age Score: 1 Gender Score: 1       ASSESSMENT AND PLAN: Persistent Atrial Fibrillation/atrial flutter (ICD10:  I48.19) The patient's CHA2DS2-VASc score is 3, indicating a 3.2% annual risk of stroke.   S/p DCCV 09/18/23 with early return of afib.  We discussed rhythm control options today including dofetilide, amiodarone, and ablation. Would like to avoid class IC and Multaq with mildly reduced EF. She would like to avoid more long term medication if possible. Will refer her to discuss ablation with EP.  Continue Eliquis  5 mg BID Continue carvedilol 6.25 mg BID  Secondary Hypercoagulable State (ICD10:  D68.69) The patient is at significant risk for stroke/thromboembolism based upon her CHA2DS2-VASc Score of 3.  Continue Apixaban  (Eliquis ). No bleeding issues.   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 Related to afib Mild to moderate TR Followed by Dr Ladona   Follow up with EP to discuss rhythm control.    [facility] [address] [place], [place] [address] [phone]

## 2023-11-20 NOTE — Progress Notes (Unsigned)
  Electrophysiology Office Note:   Date:  11/22/2023  ID:  ZEINAB RODWELL, DOB [birth year], MRN [id number]  Primary Cardiologist: Gordy Bergamo, MD Primary Heart Failure: None Electrophysiologist: None      History of Present Illness:   [name] is a 73 y.o. female with h/o uterine cancer, hypothyroidism, hypertension, valvular heart disease, CHF, atrial fibrillation seen today for  for Electrophysiology evaluation of atrial fibrillation at the request of Daril Kicks.   She presented emergency room 07/25/2023 with epigastric pain and nausea.  Symptoms lasted about an hour and then resolved.  She was noted to be in atrial fibrillation with controlled rates.  She was initially diagnosed with atrial fibrillation in 2023.  Echo showed a mildly reduced ejection fraction with moderate to severe mitral regurgitation.  She is post cardioversion 09/18/2023.  She was found to be back in atrial fibrillation at follow-up.  Her main complaint is fatigue.  She does have some mild shortness of breath.  She would prefer rhythm control.  Review of systems complete and found to be negative unless listed in HPI.   EP Information / Studies Reviewed:    EKG is ordered today. Personal review as below.        Risk Assessment/Calculations:    CHA2DS2-VASc Score = 3   This indicates a 3.2% annual risk of stroke. The patient's score is based upon: CHF History: 0 HTN History: 1 Diabetes History: 0 Stroke History: 0 Vascular Disease History: 0 Age Score: 1 Gender Score: 1            Physical Exam:   VS:  BP 108/69   Pulse 98   Ht 5' 5 (1.651 m)   Wt 172 lb (78 kg)   SpO2 94%   BMI 28.62 kg/m    Wt Readings from Last 3 Encounters:  11/22/23 172 lb (78 kg)  10/22/23 170 lb 3.2 oz (77.2 kg)  10/05/23 170 lb (77.1 kg)     GEN: Well nourished, well developed in no acute distress NECK: No JVD; No carotid bruits CARDIAC: Irregularly irregular rate and rhythm, no murmurs, rubs,  gallops RESPIRATORY:  Clear to auscultation without rales, wheezing or rhonchi  ABDOMEN: Soft, non-tender, non-distended EXTREMITIES:  No edema; No deformity   ASSESSMENT AND PLAN:    1.  Persistent atrial fibrillation/flutter: Post cardioversion 09/18/2023 with early return of atrial fibrillation.  On carvedilol.  Rates well-controlled.  She feels somewhat fatigued in atrial fibrillation.  She would benefit from rhythm control.  I think that she would be a potential candidate for ablation, though she does have likely severe mitral regurgitation.  Zeda Gangwer load amiodarone and plan for TEE cardioversion.  We can further assess her mitral valve during the TEE.  If she does have severe mitral regurgitation, surgical consultation for mitral valve repair and maze with left atrial appendage clipping may be reasonable.  2.  Secondary hypercoagulable state: On Eliquis   3.  Heart failure with reduced ejection fraction: Ejection fraction 45 to 50%.  Plan per primary cardiology.  4.  Hypertension: Well-controlled  5.  Mitral regurgitation: Found as severe on most recent echo.  Follow up with Afib Clinic as usual post procedure  Signed, Aldrich Lloyd Gladis Norton, MD

## 2023-11-22 ENCOUNTER — Ambulatory Visit: Attending: Cardiology | Admitting: Cardiology

## 2023-11-22 ENCOUNTER — Other Ambulatory Visit: Payer: Self-pay | Admitting: Cardiology

## 2023-11-22 ENCOUNTER — Telehealth: Payer: Self-pay | Admitting: Cardiology

## 2023-11-22 ENCOUNTER — Encounter: Payer: Self-pay | Admitting: Cardiology

## 2023-11-22 VITALS — BP 108/69 | HR 98 | Ht 65.0 in | Wt 172.0 lb

## 2023-11-22 DIAGNOSIS — I340 Nonrheumatic mitral (valve) insufficiency: Secondary | ICD-10-CM

## 2023-11-22 DIAGNOSIS — I5022 Chronic systolic (congestive) heart failure: Secondary | ICD-10-CM

## 2023-11-22 DIAGNOSIS — I10 Essential (primary) hypertension: Secondary | ICD-10-CM | POA: Diagnosis not present

## 2023-11-22 DIAGNOSIS — Z01812 Encounter for preprocedural laboratory examination: Secondary | ICD-10-CM

## 2023-11-22 DIAGNOSIS — I4891 Unspecified atrial fibrillation: Secondary | ICD-10-CM | POA: Diagnosis not present

## 2023-11-22 DIAGNOSIS — D6869 Other thrombophilia: Secondary | ICD-10-CM

## 2023-11-22 MED ORDER — AMIODARONE HCL 200 MG PO TABS: ORAL_TABLET | ORAL | 3 refills | Status: DC

## 2023-11-22 NOTE — Telephone Encounter (Signed)
 Spoke with pt, aware dr ladona takes care of the mitral valve and the a fib clinic will take care of the rhythm. Confirmed all the appointments with the patient.

## 2023-11-22 NOTE — Patient Instructions (Addendum)
 Medication Instructions:  Your physician has recommended you make the following change in your medication:  START Amiodarone  - take 2 tablets (400 mg total) TWICE a day for 2 weeks, then  - take 1 tablet (200 mg total) TWICE a day for 2 weeks, then  - take 1 tablet (200 mg total) ONCE a day  *If you need a refill on your cardiac medications before your next appointment, please call your pharmacy*  Lab Work: Your physician recommends that you return for lab work between 11/17 - 11/21  If you have any lab test that is abnormal or we need to change your treatment, we will call you to review the results.  Testing/Procedures: Your physician has requested that you have a TEE/Cardioversion. During a TEE, sound waves are used to create images of your heart. It provides your doctor with information about the size and shape of your heart and how well your heart's chambers and valves are working. In this test, a transducer is attached to the end of a flexible tube that is guided down you throat and into your esophagus (the tube leading from your mouth to your stomach) to get a more detailed image of your heart. Once the TEE has determined that a blood clot is not present, the cardioversion begins. Electrical Cardioversion uses a jolt of electricity to your heart either through paddles or wired patches attached to your chest. This is a controlled, usually prescheduled, procedure. This procedure is done at the hospital and you are not awake during the procedure. You usually go home the day of the procedure. Please see the instruction sheet below under other instructions    Follow-Up: At [facility], you and your health needs are our priority.  As part of our continuing mission to provide you with exceptional heart care, our providers are all part of one team.  This team includes your primary Cardiologist (physician) and Advanced Practice Providers or APPs (Physician Assistants and Nurse  Practitioners) who all work together to provide you with the care you need, when you need it.  Your next appointment:   2 week(s) after your cardioversion on   Provider:   You will follow up in the Atrial Fibrillation Clinic located at [facility]. Your provider will be: Clint R. Fenton, PA-C or Fairy Heinrich, PA-C     Thank you for choosing [employer]!!   Maeola Domino, RN [phone]   Other Instructions      Dear [name]  You are scheduled for a TEE (Transesophageal Echocardiogram) Guided Cardioversion on Monday, November 24 with Dr. Raford.  Please arrive at the [facility] (Main Entrance A) at [facility]: [address] [place], [place] [address] at 8:00 AM (This time is 1 hour(s) before your procedure to ensure your preparation).   Free valet parking service is available. You will check in at ADMITTING.   *Please Note: You will receive a call the day before your procedure to confirm the appointment time. That time may have changed from the original time based on the schedule for that day.*    DIET:  Nothing to eat or drink after midnight except a sip of water with medications (see medication instructions below)  MEDICATION INSTRUCTIONS: !!IF ANY NEW MEDICATIONS ARE STARTED AFTER TODAY, PLEASE NOTIFY YOUR PROVIDER AS SOON AS POSSIBLE!!  FYI: Medications such as Semaglutide (Ozempic, [place]), Tirzepatide (Mounjaro, Zepbound), Dulaglutide (Trulicity), etc (GLP1 agonists) AND Canagliflozin (Invokana), Dapagliflozin (Farxiga), Empagliflozin (Jardiance), Ertugliflozin (Steglatro),  Bexagliflozin [employer]) or any combination with one of these drugs such as Invokamet (Canagliflozin/Metformin), Synjardy (Empagliflozin/Metformin), etc (SGLT2 inhibitors) must be held around the time of a procedure. This is not a comprehensive list of all of these drugs. Please review all of your medications and talk to your provider if you take any one of these.  If you are not sure, ask your provider.   Hold your morning medications, TAKE ONLY YOUR ELIQUIS    Continue taking your anticoagulant (blood thinner): Apixaban  (Eliquis ).  You will need to continue this after your procedure until you are told by your provider that it is safe to stop.    LABS:  BETWEEN 11/17 - 11/21 Come to the lab at the [facility] ([address] [place], 1st Floor) between the hours of 8:00 am and 4:30 pm. You do NOT have to be fasting.  FYI:  For your safety, and to allow us  to monitor your vital signs accurately during the surgery/procedure we request: If you have artificial nails, gel coating, SNS etc, please have those removed prior to your surgery/procedure. Not having the nail coverings /polish removed may result in cancellation or delay of your surgery/procedure.  Your support person will be asked to wait in the waiting room during your procedure.  It is OK to have someone drop you off and come back when you are ready to be discharged.  You cannot drive after the procedure and will need someone to drive you home.  Bring your insurance cards.  *Special Note: Every effort is made to have your procedure done on time. Occasionally there are emergencies that occur at the hospital that may cause delays. Please be patient if a delay does occur.

## 2023-11-22 NOTE — Telephone Encounter (Signed)
 has f/u with provider 12/5, but sees aFib 12/4. Pt wondering if she still needs f/u on 12/5.

## 2023-11-23 ENCOUNTER — Encounter: Payer: Self-pay | Admitting: Cardiology

## 2023-11-23 ENCOUNTER — Telehealth: Payer: Self-pay | Admitting: Cardiology

## 2023-11-23 MED ORDER — AMIODARONE HCL 200 MG PO TABS: ORAL_TABLET | ORAL | 3 refills | Status: DC

## 2023-11-23 NOTE — Telephone Encounter (Signed)
*  STAT* If patient is at the pharmacy, call can be transferred to refill team.   1. Which medications need to be refilled? (please list name of each medication and dose if known)   amiodarone (PACERONE) 200 MG tablet   2. Would you like to learn more about the convenience, safety, & potential cost savings by using the [facility]?   3. Are you open to using the [facility] (Type [facility]. ).  4. Which pharmacy/location (including street and city if local pharmacy) is medication to be sent to?  [id number] - [place], [place] - [address]   5. Do they need a 30 day or 90 day supply?     [facility] and she wants her prescription re-sent to [id number] - [place], [place] - [address]

## 2023-11-23 NOTE — Telephone Encounter (Signed)
 RX sent in

## 2023-11-24 ENCOUNTER — Telehealth: Payer: Self-pay | Admitting: Physician Assistant

## 2023-11-24 NOTE — Telephone Encounter (Signed)
   The patient called the answering service after-hours today. She was recently started on amiodarone. She read that amiodarone and carvedilol are contraindicated together and became very nervous. She also reports he has COPD with chronic SOB and coughing. I reviewed her chart and reiterated that the combination looked very clinically appropriate and that I did not see any acute concern for why they could not be used together. We talked about warning symptoms to be checked out if she felt like her COPD, SOB or cough was worsening in any way. She also states she was scared when she saw heart failure listed on AVS as she did not recall being told this in the past. I told her I could not speak to her specific clinical diagnoses but reviewed that her echo in July showed a decreased EF which we often call the term heart failure. The patient verbalized understanding and gratitude. Told her I would send to Dr. Inocencio so that he reviews her concern about using these two meds together, but for now would continue treatment plan as above.  Graycen Degan N Xela Oregel, PA-C

## 2023-11-27 NOTE — Telephone Encounter (Signed)
 Advised pt of MD recommendation.  She is agreeable.  Advised pt to call the office if she does have to decrease to daily so we are aware.  Pt agreeable.  Aware still awaiting pharmD review/response and that a triage nurse will let her know what they say.

## 2023-11-27 NOTE — Telephone Encounter (Signed)
 Patient reporting this medication is kicking my ass -- referring to Amiodarone.  States she is not sleeping well, fatigued and itchy. Aware that Amiodarone could be the culprit to her itchiness (2-24% according to [employer]), will forward to MD for advisement but that most likely he will advised to decreased dosing (she is taking loading dosing) before we consider switching to another antiarrythmic.  Pt aware office will call back tomorrow with his advisement.  She is also asking if she can take her CBD gummies that she takes for anxiety/sleep.  She wants to ensure there is no interactions with her medications.  Aware I will forward to pharmD for review/advisement.  Pt aware that I am not in the office the remainder of the week and a triage RN will follow up with responses/advisements.  She is agreeable to plan.

## 2023-11-30 NOTE — Telephone Encounter (Signed)
 I cannot recommend CBC gummies as these are not FDA approved and there are concerns they may not contain the amount or what they claim to have in them.

## 2023-11-30 NOTE — Telephone Encounter (Signed)
 Spoke with pt and advised of message from [facility] as below.  Pt verbalizes understanding and agrees with current plan.

## 2023-12-01 ENCOUNTER — Other Ambulatory Visit (HOSPITAL_COMMUNITY): Payer: Self-pay | Admitting: Physician Assistant

## 2023-12-05 LAB — CBC

## 2023-12-06 LAB — BASIC METABOLIC PANEL WITH GFR
BUN/Creatinine Ratio: 15 (ref 12–28)
BUN: 13 mg/dL (ref 8–27)
CO2: 23 mmol/L (ref 20–29)
Calcium: 9.5 mg/dL (ref 8.7–10.3)
Chloride: 101 mmol/L (ref 96–106)
Creatinine, Ser: 0.89 mg/dL (ref 0.57–1.00)
Glucose: 105 mg/dL — ABNORMAL HIGH (ref 70–99)
Potassium: 4.4 mmol/L (ref 3.5–5.2)
Sodium: 137 mmol/L (ref 134–144)
eGFR: 68 mL/min/1.73 (ref >59)

## 2023-12-06 LAB — CBC
Hematocrit: 41.2 % (ref 34.0–46.6)
Hemoglobin: 13.5 g/dL (ref 11.1–15.9)
MCH: 32.2 pg (ref 26.6–33.0)
MCHC: 32.8 g/dL (ref 31.5–35.7)
MCV: 98 fL — AB (ref 79–97)
Platelets: 304 x10E3/uL (ref 150–450)
RBC: 4.19 x10E6/uL (ref 3.77–5.28)
RDW: 12.4 % (ref 11.7–15.4)
WBC: 6.4 x10E3/uL (ref 3.4–10.8)

## 2023-12-23 NOTE — Anesthesia Preprocedure Evaluation (Signed)
 Anesthesia Evaluation  Patient identified by MRN, date of birth, ID band Patient awake    Reviewed: Allergy & Precautions, NPO status , Patient's Chart, lab work & pertinent test results, reviewed documented beta blocker date and time   Airway Mallampati: II  TM Distance: >3 FB Neck ROM: Full    Dental no notable dental hx. (+) Teeth Intact, Dental Advisory Given   Pulmonary asthma , former smoker   Pulmonary exam normal breath sounds clear to auscultation       Cardiovascular Normal cardiovascular exam+ dysrhythmias (eliquis ) Atrial Fibrillation + Valvular Problems/Murmurs (mod/severe MR) MR  Rhythm:Regular Rate:Normal  TTE 20251. Left ventricular ejection fraction, by estimation, is 45 to 50%. The  left ventricle has mildly decreased function. The left ventricle  demonstrates global hypokinesis. The left ventricular internal cavity size  was mildly to moderately dilated. Left  ventricular diastolic function could not be evaluated.   2. Suspect severe MR given pulmonary vein reversal. Posteriorly directed  jet. Would recommend TEE for further characterization . The mitral valve  is degenerative. Moderate to severe mitral valve regurgitation. No  evidence of mitral stenosis.   3. Right ventricular systolic function is low normal. The right  ventricular size is normal. There is normal pulmonary artery systolic  pressure. The estimated right ventricular systolic pressure is 35.5 mmHg.   4. Left atrial size was severely dilated.   5. Tricuspid valve regurgitation is mild to moderate.   6. The aortic valve is normal in structure. There is mild thickening of  the aortic valve. Aortic valve regurgitation is not visualized. No aortic  stenosis is present.   7. The inferior vena cava is normal in size with greater than 50%  respiratory variability, suggesting right atrial pressure of 3 mmHg.     Neuro/Psych  PSYCHIATRIC DISORDERS   Depression    negative neurological ROS     GI/Hepatic Neg liver ROS,GERD  ,,  Endo/Other  Hypothyroidism    Renal/GU negative Renal ROS  negative genitourinary   Musculoskeletal negative musculoskeletal ROS (+)    Abdominal   Peds  Hematology negative hematology ROS (+)   Anesthesia Other Findings   Reproductive/Obstetrics                              Anesthesia Physical Anesthesia Plan  ASA: 3  Anesthesia Plan: MAC   Post-op Pain Management:    Induction: Intravenous  PONV Risk Score and Plan: Propofol  infusion and Treatment may vary due to age or medical condition  Airway Management Planned: Natural Airway  Additional Equipment:   Intra-op Plan:   Post-operative Plan:   Informed Consent: I have reviewed the patients History and Physical, chart, labs and discussed the procedure including the risks, benefits and alternatives for the proposed anesthesia with the patient or authorized representative who has indicated his/her understanding and acceptance.     Dental advisory given  Plan Discussed with: CRNA  Anesthesia Plan Comments:          Anesthesia Quick Evaluation

## 2023-12-24 ENCOUNTER — Encounter (HOSPITAL_COMMUNITY)
Admission: RE | Disposition: A | Discharge status: Home / Self Care | Payer: Self-pay | Source: Home / Self Care | Attending: Cardiovascular Disease

## 2023-12-24 ENCOUNTER — Ambulatory Visit (HOSPITAL_COMMUNITY): Payer: Self-pay | Admitting: Anesthesiology

## 2023-12-24 ENCOUNTER — Encounter (HOSPITAL_COMMUNITY): Payer: Self-pay | Admitting: Cardiovascular Disease

## 2023-12-24 ENCOUNTER — Encounter (HOSPITAL_COMMUNITY): Payer: Self-pay | Admitting: Anesthesiology

## 2023-12-24 ENCOUNTER — Ambulatory Visit (HOSPITAL_COMMUNITY)
Admission: RE | Admit: 2023-12-24 | Discharge: 2023-12-24 | Disposition: A | Discharge status: Home / Self Care | Attending: Cardiovascular Disease | Admitting: Cardiovascular Disease

## 2023-12-24 ENCOUNTER — Other Ambulatory Visit: Payer: Self-pay

## 2023-12-24 ENCOUNTER — Ambulatory Visit (HOSPITAL_COMMUNITY)

## 2023-12-24 DIAGNOSIS — I4819 Other persistent atrial fibrillation: Secondary | ICD-10-CM | POA: Diagnosis present

## 2023-12-24 DIAGNOSIS — I4892 Unspecified atrial flutter: Secondary | ICD-10-CM | POA: Diagnosis not present

## 2023-12-24 DIAGNOSIS — I110 Hypertensive heart disease with heart failure: Secondary | ICD-10-CM | POA: Diagnosis not present

## 2023-12-24 DIAGNOSIS — I340 Nonrheumatic mitral (valve) insufficiency: Secondary | ICD-10-CM

## 2023-12-24 DIAGNOSIS — I361 Nonrheumatic tricuspid (valve) insufficiency: Secondary | ICD-10-CM | POA: Diagnosis not present

## 2023-12-24 DIAGNOSIS — I480 Paroxysmal atrial fibrillation: Secondary | ICD-10-CM | POA: Diagnosis not present

## 2023-12-24 DIAGNOSIS — Z8542 Personal history of malignant neoplasm of other parts of uterus: Secondary | ICD-10-CM | POA: Insufficient documentation

## 2023-12-24 DIAGNOSIS — E039 Hypothyroidism, unspecified: Secondary | ICD-10-CM | POA: Insufficient documentation

## 2023-12-24 DIAGNOSIS — K219 Gastro-esophageal reflux disease without esophagitis: Secondary | ICD-10-CM | POA: Diagnosis not present

## 2023-12-24 DIAGNOSIS — Z87891 Personal history of nicotine dependence: Secondary | ICD-10-CM | POA: Insufficient documentation

## 2023-12-24 DIAGNOSIS — D6869 Other thrombophilia: Secondary | ICD-10-CM | POA: Insufficient documentation

## 2023-12-24 DIAGNOSIS — I4891 Unspecified atrial fibrillation: Secondary | ICD-10-CM | POA: Diagnosis not present

## 2023-12-24 DIAGNOSIS — I081 Rheumatic disorders of both mitral and tricuspid valves: Secondary | ICD-10-CM | POA: Insufficient documentation

## 2023-12-24 DIAGNOSIS — I5022 Chronic systolic (congestive) heart failure: Secondary | ICD-10-CM | POA: Diagnosis not present

## 2023-12-24 DIAGNOSIS — J45909 Unspecified asthma, uncomplicated: Secondary | ICD-10-CM

## 2023-12-24 DIAGNOSIS — Z7901 Long term (current) use of anticoagulants: Secondary | ICD-10-CM | POA: Insufficient documentation

## 2023-12-24 HISTORY — PX: TRANSESOPHAGEAL ECHOCARDIOGRAM (CATH LAB): EP1270

## 2023-12-24 HISTORY — PX: CARDIOVERSION: EP1203

## 2023-12-24 LAB — ECHO TEE: Est EF: 50

## 2023-12-24 SURGERY — TRANSESOPHAGEAL ECHOCARDIOGRAM (TEE) (CATHLAB)
Anesthesia: Monitor Anesthesia Care

## 2023-12-24 MED ORDER — SODIUM CHLORIDE 0.9 % IV SOLN: INTRAVENOUS | Status: DC

## 2023-12-24 MED ORDER — PROPOFOL 10 MG/ML IV BOLUS
INTRAVENOUS | Status: DC | PRN
  Administered 2023-12-24: 60 mg via INTRAVENOUS
  Administered 2023-12-24: 80 ug/kg/min via INTRAVENOUS

## 2023-12-24 SURGICAL SUPPLY — 1 items: PAD DEFIB RADIO PHYSIO CONN (PAD) ×2 IMPLANT

## 2023-12-24 NOTE — Progress Notes (Signed)
 Echocardiogram Echocardiogram Transesophageal has been performed.  [name] 12/24/2023, 10:29 AM

## 2023-12-24 NOTE — Transfer of Care (Signed)
 Immediate Anesthesia Transfer of Care Note  Patient: [name]  Procedure(s) Performed: TRANSESOPHAGEAL ECHOCARDIOGRAM CARDIOVERSION  Patient Location: PACU and Cath Lab  Anesthesia Type:MAC  Level of Consciousness: awake and alert   Airway & Oxygen Therapy: Patient Spontanous Breathing  Post-op Assessment: Report given to RN and Post -op Vital signs reviewed and stable  Post vital signs: Reviewed and stable  Last Vitals:  Vitals Value Taken Time  BP 121/79 12/24/23 09:56  Temp 36.8 C 12/24/23 09:56  Pulse 80 12/24/23 09:59  Resp 19 12/24/23 09:59  SpO2 94 % 12/24/23 09:59  Vitals shown include unfiled device data.  Last Pain:  Vitals:   12/24/23 0956  TempSrc: Tympanic  PainSc: 0-No pain         Complications: No notable events documented.

## 2023-12-24 NOTE — H&P (Signed)
 Per Dr. Inocencio.  No changes.      [facility] has been requested to perform a transesophageal echocardiogram and direct current cardioversion for mitral regurgitation and persistent atrial fibrillation.  After careful review of history and examination, the risks and benefits of transesophageal echocardiogram have been explained including risks of esophageal damage, perforation (1:10,000 risk), bleeding, pharyngeal hematoma as well as other potential complications associated with conscious sedation including aspiration, arrhythmia, respiratory failure and death. Alternatives to treatment were discussed, questions were answered. Patient is willing to proceed.   Kerry Tisby C. Raford, MD, [facility]  12/24/2023 9:01 AM     Electrophysiology Office Note:   Date:  12/24/2023  ID:  [name], DOB May 07, 1950, MRN [id number]  Primary Cardiologist: Gordy Bergamo, MD Primary Heart Failure: None Electrophysiologist: None      History of Present Illness:   [name] is a 73 y.o. female with h/o uterine cancer, hypothyroidism, hypertension, valvular heart disease, CHF, atrial fibrillation seen today for  for Electrophysiology evaluation of atrial fibrillation at the request of Daril Kicks.   She presented emergency room 07/25/2023 with epigastric pain and nausea.  Symptoms lasted about an hour and then resolved.  She was noted to be in atrial fibrillation with controlled rates.  She was initially diagnosed with atrial fibrillation in 2023.  Echo showed a mildly reduced ejection fraction with moderate to severe mitral regurgitation.  She is post cardioversion 09/18/2023.  She was found to be back in atrial fibrillation at follow-up.  Her main complaint is fatigue.  She does have some mild shortness of breath.  She would prefer rhythm control.  Review of systems complete and found to be negative unless listed in HPI.   EP Information / Studies Reviewed:    EKG is ordered today. Personal  review as below.        Risk Assessment/Calculations:    CHA2DS2-VASc Score = 3   This indicates a 3.2% annual risk of stroke. The patient's score is based upon: CHF History: 0 HTN History: 1 Diabetes History: 0 Stroke History: 0 Vascular Disease History: 0 Age Score: 1 Gender Score: 1            Physical Exam:   VS:  BP (!) 135/90   Pulse 89   Temp 98.5 F (36.9 C) (Temporal)   Resp 14   Ht 5' 5 (1.651 m)   Wt 78 kg   SpO2 96%   BMI 28.62 kg/m    Wt Readings from Last 3 Encounters:  12/24/23 78 kg  11/22/23 78 kg  10/22/23 77.2 kg     GEN: Well nourished, well developed in no acute distress NECK: No JVD; No carotid bruits CARDIAC: Irregularly irregular rate and rhythm, no murmurs, rubs, gallops RESPIRATORY:  Clear to auscultation without rales, wheezing or rhonchi  ABDOMEN: Soft, non-tender, non-distended EXTREMITIES:  No edema; No deformity   ASSESSMENT AND PLAN:    1.  Persistent atrial fibrillation/flutter: Post cardioversion 09/18/2023 with early return of atrial fibrillation.  On carvedilol.  Rates well-controlled.  She feels somewhat fatigued in atrial fibrillation.  She would benefit from rhythm control.  I think that she would be a potential candidate for ablation, though she does have likely severe mitral regurgitation.  Will load amiodarone  and plan for TEE cardioversion.  We can further assess her mitral valve during the TEE.  If she does have severe mitral regurgitation, surgical consultation for mitral valve repair and maze with left atrial appendage clipping may be  reasonable.  2.  Secondary hypercoagulable state: On Eliquis   3.  Heart failure with reduced ejection fraction: Ejection fraction 45 to 50%.  Plan per primary cardiology.  4.  Hypertension: Well-controlled  5.  Mitral regurgitation: Found as severe on most recent echo.  Follow up with Afib Clinic as usual post procedure  Signed, [name], MD

## 2023-12-24 NOTE — Discharge Instructions (Signed)
Electrical Cardioversion Electrical cardioversion is the delivery of a jolt of electricity to restore a normal rhythm to the heart. A rhythm that is too fast or is not regular keeps the heart from pumping well. In this procedure, sticky patches or metal paddles are placed on the chest to deliver electricity to the heart from a device. This procedure may be done in an emergency if: There is low or no blood pressure as a result of the heart rhythm. Normal rhythm must be restored as fast as possible to protect the brain and heart from further damage. It may save a life. This may also be a scheduled procedure for irregular or fast heart rhythms that are not immediately life-threatening.  What can I expect after the procedure? Your blood pressure, heart rate, breathing rate, and blood oxygen level will be monitored until you leave the hospital or clinic. Your heart rhythm will be watched to make sure it does not change. You may have some redness on the skin where the shocks were given. Over the counter cortizone cream may be helpful.  Follow these instructions at home: Do not drive for 24 hours if you were given a sedative during your procedure. Take over-the-counter and prescription medicines only as told by your health care provider. Ask your health care provider how to check your pulse. Check it often. Rest for 48 hours after the procedure or as told by your health care provider. Avoid or limit your caffeine use as told by your health care provider. Keep all follow-up visits as told by your health care provider. This is important. Contact a health care provider if: You feel like your heart is beating too quickly or your pulse is not regular. You have a serious muscle cramp that does not go away. Get help right away if: You have discomfort in your chest. You are dizzy or you feel faint. You have trouble breathing or you are short of breath. Your speech is slurred. You have trouble moving an  arm or leg on one side of your body. Your fingers or toes turn cold or blue. Summary Electrical cardioversion is the delivery of a jolt of electricity to restore a normal rhythm to the heart. This procedure may be done right away in an emergency or may be a scheduled procedure if the condition is not an emergency. Generally, this is a safe procedure. After the procedure, check your pulse often as told by your health care provider. This information is not intended to replace advice given to you by your health care provider. Make sure you discuss any questions you have with your health care provider. Document Revised: 08/19/2018 Document Reviewed: 08/19/2018 Elsevier Patient Education  2020 [employer] TEE  YOU HAD AN CARDIAC PROCEDURE TODAY: Refer to the procedure report and other information in the discharge instructions given to you for any specific questions about what was found during the examination. If this information does not answer your questions, please call [facility] office at [phone] to clarify.   DIET: Your first meal following the procedure should be a light meal and then it is ok to progress to your normal diet. A half-sandwich or bowl of soup is an example of a good first meal. Heavy or fried foods are harder to digest and may make you feel nauseous or bloated. Drink plenty of fluids but you should avoid alcoholic beverages for 24 hours. If you had a esophageal dilation, please see attached instructions for diet.   ACTIVITY: Your   care partner should take you home directly after the procedure. You should plan to take it easy, moving slowly for the rest of the day. You can resume normal activity the day after the procedure however YOU SHOULD NOT DRIVE, use power tools, machinery or perform tasks that involve climbing or major physical exertion for 24 hours (because of the sedation medicines used during the test).   SYMPTOMS TO REPORT IMMEDIATELY: A cardiologist can be  reached at any hour. Please call [phone] for any of the following symptoms:  Vomiting of blood or coffee ground material  New, significant abdominal pain  New, significant chest pain or pain under the shoulder blades  Painful or persistently difficult swallowing  New shortness of breath  Black, tarry-looking or red, bloody stools  FOLLOW UP:  Please also call with any specific questions about appointments or follow up tests.  

## 2023-12-24 NOTE — CV Procedure (Signed)
 Brief TEE Note  LVEF 50% No LA/LAA thrombus or masses Severe left atrial enlargement Severe mitral regurgitation.  Systolic flow reversal in the left sided pulmonary veins. Moderate tricuspid regurgitation.  Patient became hypoxic approximately two minutes after the first intubation due to laryngospasm.  The probe was removed and she was successfully oxygenated via bag mask.  She was then re-intubated and the procedure was completed without complications.   For additional details see full report.  Electrical Cardioversion Procedure Note [name] [id number] March 28, 1950  Procedure: Electrical Cardioversion Indications:  Atrial Fibrillation  Procedure Details Consent: Risks of procedure as well as the alternatives and risks of each were explained to the (patient/caregiver).  Consent for procedure obtained. Time Out: Verified patient identification, verified procedure, site/side was marked, verified correct patient position, special equipment/implants available, medications/allergies/relevent history reviewed, required imaging and test results available.  Performed  Patient placed on cardiac monitor, pulse oximetry, supplemental oxygen as necessary.  Sedation given: propofol  Pacer pads placed anterior and posterior chest.  Cardioverted 1 time(s).  Cardioverted at 200J.  Evaluation Findings: Post procedure EKG shows: NSR Complications: None Patient did tolerate procedure well.   Kerry Scarce, MD 12/24/2023, 9:48 AM

## 2023-12-26 NOTE — Anesthesia Postprocedure Evaluation (Signed)
 Anesthesia Post Note  Patient: [name]  Procedure(s) Performed: TRANSESOPHAGEAL ECHOCARDIOGRAM CARDIOVERSION     Patient location during evaluation: PACU Anesthesia Type: MAC Level of consciousness: awake and alert Pain management: pain level controlled Vital Signs Assessment: post-procedure vital signs reviewed and stable Respiratory status: spontaneous breathing, nonlabored ventilation, respiratory function stable and patient connected to nasal cannula oxygen Cardiovascular status: stable and blood pressure returned to baseline Postop Assessment: no apparent nausea or vomiting Anesthetic complications: no   No notable events documented.  Last Vitals:  Vitals:   12/24/23 1006 12/24/23 1016  BP: 121/79 119/79  Pulse: 71 71  Resp: 16 20  Temp:    SpO2: 96% 96%    Last Pain:  Vitals:   12/24/23 1016  TempSrc:   PainSc: 0-No pain                 Lane Eland L Jakyri Brunkhorst

## 2024-01-03 ENCOUNTER — Ambulatory Visit (HOSPITAL_COMMUNITY)
Admission: RE | Admit: 2024-01-03 | Discharge: 2024-01-03 | Attending: Physician Assistant | Admitting: Physician Assistant

## 2024-01-03 VITALS — BP 122/64 | HR 51 | Ht 65.0 in | Wt 166.2 lb

## 2024-01-03 DIAGNOSIS — I4891 Unspecified atrial fibrillation: Secondary | ICD-10-CM | POA: Diagnosis not present

## 2024-01-03 DIAGNOSIS — D6869 Other thrombophilia: Secondary | ICD-10-CM | POA: Diagnosis not present

## 2024-01-03 DIAGNOSIS — I4819 Other persistent atrial fibrillation: Secondary | ICD-10-CM

## 2024-01-03 NOTE — Progress Notes (Unsigned)
 Cardiology Office Note:  .   Date:  01/04/2024  ID:  [name], DOB [birth year], MRN [id number] PCP: Burney Darice CROME, MD  [facility] Providers Cardiologist:  Gordy Bergamo, MD   History of Present Illness: [name]   [name] is a 73 y.o. Caucasian female patient who presented to the ED on 07/25/2023 with epigastric discomfort and nausea and found to be in A-fib and started on Eliquis  on 08/02/2023. Echo showed mildly reduced EF 45-50%, moderate to severe MR. She is s/p DCCV 09/18/23 but was found to be back in afib at follow up on 10/05/23.  Patient was seen by Dr Ruiz and started on amiodarone  with DCCV on 12/24/23.    She was seen yesterday at A-fib clinic and she was maintaining sinus rhythm, TEE performed prior to DCCV revealed severe functional MR without significant mitral valve abnormality.    She is here to establish cardiac care and management of mitral regurgitation and recommendations.    Discussed the use of AI scribe software for clinical note transcription with the patient, who gave verbal consent to proceed.  History of Present Illness [name] is a 73 year old female with atrial fibrillation and mitral valve regurgitation who presents for follow-up after cardioversion. She was referred by Dr. Thedore for evaluation of her atrial fibrillation and mitral valve regurgitation.  She presented to the emergency room on July 25, 2023, with abdominal discomfort and nausea and was found to be in atrial fibrillation with rapid ventricular response. She was started on Eliquis  and later underwent a TEE and cardioversion in August 2025, which restored sinus rhythm. She was placed on amiodarone  but stopped it because of severe side effects.  She feels tired but has not noticed a change in energy or shortness of breath since cardioversion. She remains sedentary but can perform household chores, care for pets, and climb stairs without limitation.  Her mitral  regurgitation progressed from moderate to severe between June and August 2025. She had a TEE before cardioversion to exclude thrombus. She denies significant exertional shortness of breath.  Current medications are carvedilol 0.25 mg twice daily and Eliquis  for atrial fibrillation. She is no longer taking amiodarone  due to intolerance.  Cardiac Studies relevent.     ECHO TEE 12/24/2023  1. Left ventricular ejection fraction, by estimation, is 50%. The left ventricle has low normal function. The left ventricle has no regional wall motion abnormalities. 2. Right ventricular systolic function is normal. The right ventricular size is normal. 3. Left atrial size was severely dilated. No left atrial/left atrial appendage thrombus was detected. 4. ERO 0.5 cm^2. By PISA the regurgitant volume is 79 mL/beat. PISA radius 1.08 cm. VTI 158.9 cm. Functional mitral regurgitation due to severe left atrial enlargement. The mitral valve is normal in structure. Severe mitral valve regurgitation. No evidence of mitral stenosis. 5. Tricuspid valve regurgitation is mild to moderate. PASP 30 mm Hg.   MYOCARDIAL PERFUSION IMAGING 08/27/2023   Normal myocardial perfusion without obvious evidence of reversible ischemia or prior infarct.   Left ventricular function is normal. Nuclear stress EF: 62%. The left ventricular ejection fraction is normal (55-65%). End diastolic cavity size is normal.  ECHOCARDIOGRAM COMPLETE 08/07/2023  1. Left ventricular ejection fraction, by estimation, is 45 to 50%. The left ventricle has mildly decreased function. The left ventricle demonstrates global hypokinesis. The left ventricular internal cavity size was mildly to moderately dilated. Left ventricular diastolic function could not be evaluated. 2. Suspect severe MR  given pulmonary vein reversal. Posteriorly directed jet. Would recommend TEE for further characterization . The mitral valve is degenerative. Moderate to severe mitral valve  regurgitation. No evidence of mitral stenosis. 3. Right ventricular systolic function is low normal. The right ventricular size is normal. There is normal pulmonary artery systolic pressure. The estimated right ventricular systolic pressure is 35.5 mmHg. 4. Left atrial size was severely dilated. 5. Tricuspid valve regurgitation is mild to moderate.  Labs   Recent Labs    07/25/23 2057 08/17/23 1005 08/30/23 1116 12/05/23 0908  NA 137 139 139 137  K 4.4 4.7 4.4 4.4  CL 104 102 100 101  CO2 25 19* 21 23  GLUCOSE 106* 99 102* 105*  BUN 13 15 12 13   CREATININE 0.78 0.82 0.73 0.89  CALCIUM 9.5 9.8 9.4 9.5  GFRNONAA >60  --   --   --     Lab Results  Component Value Date   ALT 9 08/17/2023   AST 17 08/17/2023   ALKPHOS 86 08/17/2023   BILITOT 1.1 08/17/2023      Latest Ref Rng & Units 12/05/2023    9:08 AM 08/30/2023   11:16 AM 08/17/2023   10:05 AM  CBC  WBC 3.4 - 10.8 x10E3/uL 6.4  6.7  8.6   Hemoglobin 11.1 - 15.9 g/dL 86.4  86.1  85.2   Hematocrit 34.0 - 46.6 % 41.2  42.7  45.4   Platelets 150 - 450 x10E3/uL 304  323  331    Lab Results  Component Value Date   HGBA1C 5.4 03/21/2013    Lab Results  Component Value Date   TSH 2.730 08/17/2023     ROS  Review of Systems  Cardiovascular:  Negative for chest pain, dyspnea on exertion and leg swelling.   Physical Exam:   VS:  BP 102/60 (BP Location: Left Arm, Patient Position: Sitting, Cuff Size: Normal)   Pulse 63   Resp 16   Ht 5' 5 (1.651 m)   Wt 167 lb 8 oz (76 kg)   SpO2 94%   BMI 27.87 kg/m    Wt Readings from Last 3 Encounters:  01/04/24 167 lb 8 oz (76 kg)  01/03/24 166 lb 3.2 oz (75.4 kg)  12/24/23 172 lb (78 kg)    BP Readings from Last 3 Encounters:  01/04/24 102/60  01/03/24 122/64  12/24/23 119/79   Physical Exam Neck:     Vascular: No carotid bruit or JVD.  Cardiovascular:     Rate and Rhythm: Normal rate and regular rhythm.     Pulses: Intact distal pulses.     Heart sounds: S1  normal and S2 normal. Murmur heard.     Mid to late systolic murmur is present with a grade of 2/6 at the apex.     No gallop.  Pulmonary:     Effort: Pulmonary effort is normal.     Breath sounds: Normal breath sounds.  Abdominal:     General: Bowel sounds are normal.     Palpations: Abdomen is soft.  Musculoskeletal:     Right lower leg: No edema.     Left lower leg: No edema.    EKG:       Personally reviewed  EKG 09/23/2023 sinus bradycardia at rate of 51 bpm, left anterior fascicular block.  Poor R wave progression, cannot exclude anteroseptal infarct old.  Compared to 12/24/2023, no change however compared to 11/22/2023, atrial fibrillation has been replaced.  ASSESSMENT AND PLAN: .  ICD-10-CM   1. Severe mitral regurgitation  I34.0 ECHOCARDIOGRAM LIMITED    2. PAF (paroxysmal atrial fibrillation) (HCC)  I48.0 amiodarone  (PACERONE ) 200 MG tablet    3. Primary hypertension  I10     4. Mixed hypercholesterolemia and hypertriglyceridemia  E78.2      Assessment & Plan Severe mitral regurgitation Likely contributing to paroxysmal atrial fibrillation. Recent TEE showed severe valve leakage. Currently asymptomatic with no heart failure symptoms. Mitral valve repair considered but deferred due to lack of symptoms and potential improvement post-cardioversion. - Will repeat limited echocardiogram in three months to assess mitral valve regurgitation. - Monitor for symptoms such as shortness of breath or orthopnea and report immediately if she occurs. - Will discuss potential for minimally invasive mitral valve repair and simultaneous A-fib ablation if surgery is pursued.  Paroxysmal atrial fibrillation Previously managed with cardioversion and amiodarone . Currently in sinus rhythm. Amiodarone  was discontinued due to adverse effects. Considering restarting at a lower dose to manage AFib. - Restart amiodarone  at 100 mg daily by cutting the 200 mg tablet in half. - Cancelled AFib  clinic appointments as management will be coordinated with current provider. - Discussed with Dr. Inocencio who agrees with the plans on waiting for referral for mitral valve repair, we will repeat echocardiogram in 3 months and if MR is not severe, we will refer her for atrial fibrillation ablation stand-alone.  She would be a good candidate for ablation. If MR continues to be severe, then will refer her to [facility] minimally invasive MV repair and ablation at the same time.   Primary hypertension Blood pressure is well-controlled on carvedilol 0.25 mg twice daily. No symptoms of hypotension reported. - Continue carvedilol 0.25 mg twice daily.   Follow up: 3 months after echocardiogram.  This was a complex office visit and discussions regarding various options for management of mitral valve regurgitation, discussions regarding watchful waiting and surgery, discussions regarding atrial fibrillation ablation and management of atrial fibrillation and antiarrhythmic therapy.  Patient is also new to me.  Signed,  Gordy Bergamo, MD, [facility] 01/04/2024, 3:14 PM [facility] [address] [place], [place] [address] Phone: [phone]. Fax:  [phone]

## 2024-01-03 NOTE — Progress Notes (Signed)
 Primary Care Physician: Burney Darice CROME, MD Primary Cardiologist: Gordy Bergamo, MD Electrophysiologist: None  Referring Physician: ED   [name] is a 73 y.o. female with a history of uterine cancer, hypothyroidism, HTN, VHD, CHF, atrial fibrillation who presents for follow up in the [facility].  The patient presented to the ED 07/25/23 with symptoms of epigastric pain and nausea. The symptoms lasted about one hour and then resolved. She was noted to be in afib with controlled rates. Patient stated she had previously been diagnosed with afib in 2023 by her PCP. Patient is unsure if she has been persistent since that time as she is asymptomatic. Started on Eliquis  08/02/23. Echo showed mildly reduced EF 45-50%, moderate to severe MR. She is s/p DCCV 09/18/23 but was found to be back in afib at follow up on 10/05/23.  Patient returns for follow up for atrial fibrillation. Patient was seen by Dr Inocencio and started on amiodarone  with DCCV on 12/24/23. She is in SR today. However, she stopped amiodarone  due to intolerable side effects. TEE at the time of DCCV showed severe functional MR due to severely dilated LA. She feels better off amiodarone .   Today, she  denies symptoms of palpitations, chest pain, shortness of breath, orthopnea, PND, lower extremity edema, dizziness, presyncope, syncope, snoring, daytime somnolence, bleeding, or neurologic sequela. The patient is tolerating medications without difficulties and is otherwise without complaint today.    Atrial Fibrillation Risk Factors:  she does not have symptoms or diagnosis of sleep apnea. she does not have a history of rheumatic fever. she does have a history of alcohol use.   Atrial Fibrillation Management history:  Previous antiarrhythmic drugs: amiodarone   Previous cardioversions: 09/18/23, 12/24/23 Previous ablations: none Anticoagulation history: Eliquis   ROS- All systems are reviewed and negative  except as per the HPI above.  Past Medical History:  Diagnosis Date   Allergic rhinitis    Asthma    Atypical nevus 10/09/2003   left ant thigh-mod (EXC)   Atypical nevus 10/04/2005   left calf-moderate   Cancer (HCC) 04/2008   uterine   Depression    GERD (gastroesophageal reflux disease)    History of uterine cancer    Hypothyroidism    Cytomel Rx'ed by Dr.Vaughan    Current Outpatient Medications  Medication Sig Dispense Refill   Acetylcysteine (NAC) 500 MG CAPS Take 500 mg by mouth daily.     apixaban  (ELIQUIS ) 5 MG TABS tablet TAKE 1 TABLET BY MOUTH TWICE A DAY 60 tablet 6   budesonide-glycopyrrolate-formoterol (BREZTRI AEROSPHERE) 160-9-4.8 MCG/ACT AERO inhaler Inhale 2 puffs into the lungs 2 (two) times daily. (Patient taking differently: Inhale 2 puffs into the lungs as needed.)     buPROPion (WELLBUTRIN XL) 300 MG 24 hr tablet Take 300 mg by mouth every morning.     Carboxymethylcell-Glycerin PF (REFRESH RELIEVA PF) 0.5-1 % SOLN Place 1 drop into both eyes daily as needed (dry eye).     carvedilol (COREG) 6.25 MG tablet Take 6.25 mg by mouth 2 (two) times daily.     Cholecalciferol  (VITAMIN D -3 PO) Take 5,000 Units by mouth daily.     Coenzyme Q10 (COQ10) 200 MG CAPS Take 200 mg by mouth See admin instructions. 1 by mouth 4-5 x weekly     FLUoxetine  (PROZAC ) 20 MG capsule TAKE ONE CAPSULE BY MOUTH EVERY DAY 90 capsule 3   fluticasone  (FLONASE ) 50 MCG/ACT nasal spray Place 1 spray into the nose as directed.  1 spray in each nostril twice a day as needed. Use the crossover technique as discussed (Patient taking differently: Place 1 spray into the nose as needed. 1 spray in each nostril twice a day as needed. Use the crossover technique as discussed) 16 g 2   GARLIC PO Take 3 tablets by mouth every morning.     loperamide (IMODIUM) 2 MG capsule Take 2-4 mg by mouth as needed for diarrhea or loose stools.     LORazepam (ATIVAN) 0.5 MG tablet Take 0.25-0.5 mg by mouth 2 (two)  times daily as needed for anxiety.     Menaquinone-7 (K2 PO) Take 90 mcg by mouth daily.     metroNIDAZOLE  (METROGEL ) 0.75 % gel Apply 1 application topically 2 (two) times daily. 45 g 5   Multiple Vitamin (MULTIVITAMIN PO) Take 3 tablets by mouth daily. Iron free     Multiple Vitamins-Minerals (ZINC PO) Take 30 mg by mouth daily.     Omega-3 Fatty Acids (FISH OIL PO) Take 1 capsule by mouth 3 (three) times daily.     OVER THE COUNTER MEDICATION Take 2 tablets by mouth at bedtime. Ultra Mag     OVER THE COUNTER MEDICATION Take 1 tablet by mouth at bedtime as needed (Sleep). Power to sleep PM (Patient taking differently: Take 1 tablet by mouth at bedtime as needed (Sleep). Melatonin- Herbs for sleep-)     OVER THE COUNTER MEDICATION Take 1 drop by mouth as needed (Pain/Anxiety). CBD drops/drink     Quercetin 500 MG CAPS Take 500 mg by mouth daily.     THEANINE PO Take 1 tablet by mouth daily. With magnesium     TURMERIC PO Take 2 tablets by mouth 2 (two) times daily as needed (Inflammation).     No current facility-administered medications for this encounter.    Physical Exam: BP 122/64   Pulse (!) 51   Ht 5' 5 (1.651 m)   Wt 75.4 kg   BMI 27.66 kg/m   GEN: Well nourished, well developed in no acute distress CARDIAC: Regular rate and rhythm, no murmurs, rubs, gallops RESPIRATORY:  Clear to auscultation without rales, wheezing or rhonchi  ABDOMEN: Soft, non-tender, non-distended EXTREMITIES:  No edema; No deformity    Wt Readings from Last 3 Encounters:  01/03/24 75.4 kg  12/24/23 78 kg  11/22/23 78 kg     EKG Interpretation Date/Time:  Thursday January 03 2024 08:42:25 EST Ventricular Rate:  51 PR Interval:  158 QRS Duration:  98 QT Interval:  440 QTC Calculation: 405 R Axis:   -39  Text Interpretation: Sinus bradycardia Left axis deviation Cannot rule out Anterior infarct , age undetermined Abnormal ECG When compared with ECG of 24-Dec-2023 10:05, No significant change  was found Confirmed by Jerred Zaremba ([id number]) on 01/03/2024 8:59:50 AM    Echo 08/07/23  1. Left ventricular ejection fraction, by estimation, is 45 to 50%. The  left ventricle has mildly decreased function. The left ventricle  demonstrates global hypokinesis. The left ventricular internal cavity size  was mildly to moderately dilated. Left  ventricular diastolic function could not be evaluated.   2. Suspect severe MR given pulmonary vein reversal. Posteriorly directed  jet. Would recommend TEE for further characterization . The mitral valve  is degenerative. Moderate to severe mitral valve regurgitation. No  evidence of mitral stenosis.   3. Right ventricular systolic function is low normal. The right  ventricular size is normal. There is normal pulmonary artery systolic  pressure. The estimated  right ventricular systolic pressure is 35.5 mmHg.   4. Left atrial size was severely dilated.   5. Tricuspid valve regurgitation is mild to moderate.   6. The aortic valve is normal in structure. There is mild thickening of  the aortic valve. Aortic valve regurgitation is not visualized. No aortic  stenosis is present.   7. The inferior vena cava is normal in size with greater than 50%  respiratory variability, suggesting right atrial pressure of 3 mmHg.    CHA2DS2-VASc Score = 3  The patient's score is based upon: CHF History: 0 HTN History: 1 Diabetes History: 0 Stroke History: 0 Vascular Disease History: 0 Age Score: 1 Gender Score: 1       ASSESSMENT AND PLAN: Persistent Atrial Fibrillation/atrial flutter (ICD10:  I48.19) The patient's CHA2DS2-VASc score is 3, indicating a 3.2% annual risk of stroke.   S/p DCCV 12/24/23 Patient appears to be maintaining SR. She stopped amiodarone  due to intolerable side effects.  She has severe MR. She has an upcoming visit with primary cardiology team to discuss treatment options. If surgical candidate, could consider MAZE and LAA clip.  Continue  Eliquis  5 mg BID Continue carvedilol 6.25 mg BID  Secondary Hypercoagulable State (ICD10:  D68.69) The patient is at significant risk for stroke/thromboembolism based upon her CHA2DS2-VASc Score of 3.  Continue Apixaban  (Eliquis ). No bleeding issues.   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    Follow up with Dr Ladona as scheduled.    [facility] [address] [place], [place] [address] [phone]

## 2024-01-04 ENCOUNTER — Ambulatory Visit: Attending: Cardiology | Admitting: Cardiology

## 2024-01-04 ENCOUNTER — Encounter: Payer: Self-pay | Admitting: Cardiology

## 2024-01-04 VITALS — BP 102/60 | HR 63 | Resp 16 | Ht 65.0 in | Wt 167.5 lb

## 2024-01-04 DIAGNOSIS — I340 Nonrheumatic mitral (valve) insufficiency: Secondary | ICD-10-CM | POA: Diagnosis not present

## 2024-01-04 DIAGNOSIS — I480 Paroxysmal atrial fibrillation: Secondary | ICD-10-CM | POA: Diagnosis not present

## 2024-01-04 DIAGNOSIS — I10 Essential (primary) hypertension: Secondary | ICD-10-CM

## 2024-01-04 DIAGNOSIS — E782 Mixed hyperlipidemia: Secondary | ICD-10-CM | POA: Diagnosis not present

## 2024-01-04 MED ORDER — AMIODARONE HCL 200 MG PO TABS: 100.0000 mg | ORAL_TABLET | Freq: Every day | ORAL | Status: DC

## 2024-01-04 NOTE — Patient Instructions (Addendum)
 Testing/Procedures: ECHOCARDIOGRAM (limited)  Your physician has requested that you have an echocardiogram. Echocardiography is a painless test that uses sound waves to create images of your heart. It provides your doctor with information about the size and shape of your heart and how well your heart's chambers and valves are working. This procedure takes approximately one hour. There are no restrictions for this procedure. Please do NOT wear cologne, perfume, aftershave, or lotions (deodorant is allowed). Please arrive 15 minutes prior to your appointment time.  Please note: We ask at that you not bring children with you during ultrasound (echo/ vascular) testing. Due to room size and safety concerns, children are not allowed in the ultrasound rooms during exams. Our front office staff cannot provide observation of children in our lobby area while testing is being conducted. An adult accompanying a patient to their appointment will only be allowed in the ultrasound room at the discretion of the ultrasound technician under special circumstances. We apologize for any inconvenience.   Follow-Up: At [facility], you and your health needs are our priority.  As part of our continuing mission to provide you with exceptional heart care, our providers are all part of one team.  This team includes your primary Cardiologist (physician) and Advanced Practice Providers or APPs (Physician Assistants and Nurse Practitioners) who all work together to provide you with the care you need, when you need it.  Your next appointment:   Monday, April 07, 2024 at 9am  Provider:   Gordy Bergamo, MD

## 2024-02-19 ENCOUNTER — Other Ambulatory Visit: Payer: Self-pay | Admitting: Cardiology

## 2024-02-19 DIAGNOSIS — I480 Paroxysmal atrial fibrillation: Secondary | ICD-10-CM

## 2024-02-26 NOTE — Telephone Encounter (Signed)
 In accordance with refill protocols, please review and address the following requirements before this medication refill can be authorized:  Chest X-ray and Other testing

## 2024-04-01 ENCOUNTER — Ambulatory Visit (HOSPITAL_COMMUNITY)

## 2024-04-07 ENCOUNTER — Ambulatory Visit: Admitting: Cardiology
